# Patient Record
Sex: Female | Born: 2000 | Race: White | Hispanic: No | Marital: Single | Smoking: Current every day smoker
Health system: Southern US, Community
[De-identification: ages and names within clinical notes are randomized; demographics above are authoritative.]

## PROBLEM LIST (undated history)

## (undated) DIAGNOSIS — F419 Anxiety disorder, unspecified: Secondary | ICD-10-CM

## (undated) DIAGNOSIS — F32A Depression, unspecified: Secondary | ICD-10-CM

## (undated) DIAGNOSIS — E282 Polycystic ovarian syndrome: Secondary | ICD-10-CM

## (undated) DIAGNOSIS — F909 Attention-deficit hyperactivity disorder, unspecified type: Secondary | ICD-10-CM

## (undated) DIAGNOSIS — R569 Unspecified convulsions: Secondary | ICD-10-CM

## (undated) DIAGNOSIS — G40B09 Juvenile myoclonic epilepsy, not intractable, without status epilepticus: Secondary | ICD-10-CM

## (undated) HISTORY — DX: Polycystic ovarian syndrome: E28.2

## (undated) HISTORY — DX: Juvenile myoclonic epilepsy, not intractable, without status epilepticus: G40.B09

## (undated) HISTORY — DX: Depression, unspecified: F32.A

## (undated) HISTORY — DX: Anxiety disorder, unspecified: F41.9

## (undated) HISTORY — DX: Unspecified convulsions: R56.9

---
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
## 2019-02-14 ENCOUNTER — Other Ambulatory Visit: Payer: Self-pay

## 2019-02-14 ENCOUNTER — Telehealth: Payer: Self-pay

## 2019-02-14 DIAGNOSIS — Z79899 Other long term (current) drug therapy: Secondary | ICD-10-CM

## 2019-02-14 DIAGNOSIS — G40B09 Juvenile myoclonic epilepsy, not intractable, without status epilepticus: Secondary | ICD-10-CM

## 2019-02-14 NOTE — Telephone Encounter (Signed)
Pt mom called asking if it was time for pt to have her lamotrigine level checked?  Also asking for a letter on behalf of Lacey Bright because she did not do well in her freshman year of college she did not meet the 67% for her financial aide and they are doing an appeal for medical reasons because she had 4 seizures due to lack of sleep (nov, Dec, Feb and last April 12th) and also had increase in anxiety

## 2019-02-18 ENCOUNTER — Encounter: Payer: Self-pay | Admitting: Neurology

## 2019-02-23 ENCOUNTER — Telehealth: Payer: Self-pay

## 2019-02-23 ENCOUNTER — Encounter: Payer: Self-pay | Admitting: Neurology

## 2019-02-23 NOTE — Telephone Encounter (Signed)
Pt mother called informed that letter was at the front for her to be able to come by and pick up

## 2019-03-09 ENCOUNTER — Telehealth: Payer: Self-pay | Admitting: Neurology

## 2019-03-09 NOTE — Telephone Encounter (Signed)
Mom left vm about if we had results back from her levels (lab work) so she can figure out if she needs to change the meds. Please call her back. Thanks!

## 2019-03-10 NOTE — Telephone Encounter (Signed)
Left message for mom to return call.  Lamictal order was placed 02/14/19 but does not look like it was drawn. Called [facility] and the last order they have is from March. They will fax over the results. May need pt to come back and draw since June lab never performed.

## 2019-03-10 NOTE — Telephone Encounter (Signed)
Spoke with mom. Informed that June lab had not resulted. Mom will have pt come back here and have lab drawn. Order valid through 01/2020

## 2019-05-23 ENCOUNTER — Telehealth: Payer: Self-pay | Admitting: Neurology

## 2019-05-23 NOTE — Telephone Encounter (Signed)
Mother left VM about needing to speak with the nurse. Thanks!

## 2019-05-24 NOTE — Telephone Encounter (Signed)
Spoke with mother, she had a seizure over the weekend. She was drinking heavily, did not get good sleep, had a seizure with her roommates the next morning Saturday. Roommate said she turned blue for a short period of time, then she was fine. Called EMS. Currently she has been living in [place] in a townhouse taking online classes through [employer]. Mother also reports that she has not had her Lamictal level checked the last time.

## 2019-05-25 ENCOUNTER — Telehealth (INDEPENDENT_AMBULATORY_CARE_PROVIDER_SITE_OTHER): Payer: Self-pay | Admitting: Neurology

## 2019-05-25 ENCOUNTER — Other Ambulatory Visit: Payer: Self-pay

## 2019-05-25 ENCOUNTER — Encounter: Payer: Self-pay | Admitting: Neurology

## 2019-05-25 VITALS — Ht 64.5 in | Wt 164.0 lb

## 2019-05-25 DIAGNOSIS — G40B09 Juvenile myoclonic epilepsy, not intractable, without status epilepticus: Secondary | ICD-10-CM

## 2019-05-25 MED ORDER — LAMOTRIGINE ER 300 MG PO TB24: ORAL_TABLET | ORAL | 3 refills | Status: DC
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
## 2019-07-08 ENCOUNTER — Other Ambulatory Visit: Payer: Self-pay

## 2019-07-08 DIAGNOSIS — Z20822 Contact with and (suspected) exposure to covid-19: Secondary | ICD-10-CM

## 2019-07-09 LAB — NOVEL CORONAVIRUS, NAA: SARS-CoV-2, NAA: DETECTED — AB

## 2019-11-28 ENCOUNTER — Ambulatory Visit: Payer: Self-pay

## 2019-12-09 ENCOUNTER — Other Ambulatory Visit: Payer: Self-pay | Admitting: Neurology

## 2019-12-13 ENCOUNTER — Ambulatory Visit: Payer: Self-pay | Admitting: Neurology

## 2020-01-05 ENCOUNTER — Other Ambulatory Visit: Payer: Self-pay

## 2020-01-05 MED ORDER — LAMOTRIGINE ER 300 MG PO TB24: ORAL_TABLET | ORAL | 0 refills | Status: DC

## 2020-01-16 ENCOUNTER — Other Ambulatory Visit: Payer: Self-pay

## 2020-01-16 ENCOUNTER — Ambulatory Visit (HOSPITAL_COMMUNITY)
Admission: EM | Admit: 2020-01-16 | Discharge: 2020-01-16 | Disposition: A | Discharge status: Home / Self Care | Payer: BC Managed Care – PPO | Attending: Family Medicine | Admitting: Family Medicine

## 2020-01-16 ENCOUNTER — Encounter (HOSPITAL_COMMUNITY): Payer: Self-pay

## 2020-01-16 ENCOUNTER — Ambulatory Visit (INDEPENDENT_AMBULATORY_CARE_PROVIDER_SITE_OTHER): Payer: BC Managed Care – PPO

## 2020-01-16 DIAGNOSIS — J039 Acute tonsillitis, unspecified: Secondary | ICD-10-CM | POA: Diagnosis not present

## 2020-01-16 DIAGNOSIS — Z8669 Personal history of other diseases of the nervous system and sense organs: Secondary | ICD-10-CM | POA: Insufficient documentation

## 2020-01-16 DIAGNOSIS — Z20822 Contact with and (suspected) exposure to covid-19: Secondary | ICD-10-CM | POA: Insufficient documentation

## 2020-01-16 DIAGNOSIS — F909 Attention-deficit hyperactivity disorder, unspecified type: Secondary | ICD-10-CM | POA: Diagnosis not present

## 2020-01-16 DIAGNOSIS — Z79899 Other long term (current) drug therapy: Secondary | ICD-10-CM | POA: Diagnosis not present

## 2020-01-16 DIAGNOSIS — Z8616 Personal history of COVID-19: Secondary | ICD-10-CM | POA: Insufficient documentation

## 2020-01-16 DIAGNOSIS — J069 Acute upper respiratory infection, unspecified: Secondary | ICD-10-CM

## 2020-01-16 LAB — POCT RAPID STREP A: Streptococcus, Group A Screen (Direct): NEGATIVE

## 2020-01-16 MED ORDER — AMOXICILLIN-POT CLAVULANATE 875-125 MG PO TABS
1.0000 | ORAL_TABLET | Freq: Two times a day (BID) | ORAL | 0 refills | Status: DC
Start: 2020-01-16 — End: 2020-12-11

## 2020-01-16 NOTE — ED Triage Notes (Signed)
Pt is here with cough, sore throat, chills, nasal congestion for 4 days now. Pt has taken Mucinex to relieve discomfort.

## 2020-01-16 NOTE — Discharge Instructions (Signed)
I am giving you antibiotics because of the tonsillitis and the close exposure to pneumonia Rest and push fluids Return if not improving in a  few days
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
## 2020-01-17 LAB — SARS CORONAVIRUS 2 (TAT 6-24 HRS): SARS Coronavirus 2: NEGATIVE

## 2020-01-18 LAB — CULTURE, GROUP A STREP (THRC)

## 2020-04-02 ENCOUNTER — Other Ambulatory Visit: Payer: Self-pay | Admitting: Neurology

## 2020-05-01 ENCOUNTER — Ambulatory Visit: Payer: Self-pay | Admitting: Neurology

## 2020-05-01 ENCOUNTER — Telehealth (INDEPENDENT_AMBULATORY_CARE_PROVIDER_SITE_OTHER): Payer: Self-pay | Admitting: Neurology

## 2020-05-01 ENCOUNTER — Encounter: Payer: Self-pay | Admitting: Neurology

## 2020-05-01 ENCOUNTER — Other Ambulatory Visit: Payer: Self-pay | Admitting: Neurology

## 2020-05-01 ENCOUNTER — Other Ambulatory Visit: Payer: Self-pay

## 2020-05-01 VITALS — Wt 157.0 lb

## 2020-05-01 DIAGNOSIS — G40B09 Juvenile myoclonic epilepsy, not intractable, without status epilepticus: Secondary | ICD-10-CM
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
## 2020-05-30 ENCOUNTER — Encounter: Payer: Self-pay | Admitting: Neurology

## 2020-12-11 ENCOUNTER — Telehealth (INDEPENDENT_AMBULATORY_CARE_PROVIDER_SITE_OTHER): Payer: BC Managed Care – PPO | Admitting: Neurology

## 2020-12-11 ENCOUNTER — Encounter: Payer: Self-pay | Admitting: Neurology

## 2020-12-11 ENCOUNTER — Other Ambulatory Visit: Payer: Self-pay

## 2020-12-11 DIAGNOSIS — G40B09 Juvenile myoclonic epilepsy, not intractable, without status epilepticus: Secondary | ICD-10-CM

## 2020-12-11 MED ORDER — LAMOTRIGINE ER 300 MG PO TB24: 1.0000 | ORAL_TABLET | Freq: Every day | ORAL | 3 refills | Status: DC
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
## 2020-12-24 ENCOUNTER — Ambulatory Visit: Payer: Self-pay | Admitting: Neurology

## 2021-01-12 ENCOUNTER — Telehealth (HOSPITAL_COMMUNITY): Payer: Self-pay

## 2021-01-12 ENCOUNTER — Ambulatory Visit (HOSPITAL_COMMUNITY)
Admission: EM | Admit: 2021-01-12 | Discharge: 2021-01-12 | Disposition: A | Discharge status: Home / Self Care | Payer: BC Managed Care – PPO | Attending: Medical Oncology | Admitting: Medical Oncology

## 2021-01-12 ENCOUNTER — Other Ambulatory Visit: Payer: Self-pay

## 2021-01-12 ENCOUNTER — Encounter (HOSPITAL_COMMUNITY): Payer: Self-pay | Admitting: *Deleted

## 2021-01-12 DIAGNOSIS — J069 Acute upper respiratory infection, unspecified: Secondary | ICD-10-CM | POA: Insufficient documentation

## 2021-01-12 DIAGNOSIS — Z20822 Contact with and (suspected) exposure to covid-19: Secondary | ICD-10-CM | POA: Diagnosis not present

## 2021-01-12 DIAGNOSIS — R0602 Shortness of breath: Secondary | ICD-10-CM | POA: Insufficient documentation

## 2021-01-12 MED ORDER — BENZONATATE 100 MG PO CAPS
100.0000 mg | ORAL_CAPSULE | Freq: Three times a day (TID) | ORAL | 0 refills | Status: DC
Start: 2021-01-12 — End: 2021-01-12

## 2021-01-12 MED ORDER — FLUTICASONE PROPIONATE 50 MCG/ACT NA SUSP
2.0000 | Freq: Every day | NASAL | 0 refills | Status: DC
Start: 2021-01-12 — End: 2021-01-12

## 2021-01-12 MED ORDER — ALBUTEROL SULFATE HFA 108 (90 BASE) MCG/ACT IN AERS: 1.0000 | INHALATION_SPRAY | Freq: Four times a day (QID) | RESPIRATORY_TRACT | 0 refills | Status: DC | PRN

## 2021-01-12 MED ORDER — BENZONATATE 100 MG PO CAPS: 100.0000 mg | ORAL_CAPSULE | Freq: Three times a day (TID) | ORAL | 0 refills | Status: DC

## 2021-01-12 MED ORDER — FLUTICASONE PROPIONATE 50 MCG/ACT NA SUSP: 2.0000 | Freq: Every day | NASAL | 0 refills | Status: DC

## 2021-01-12 NOTE — ED Provider Notes (Signed)
[facility]    CSN: [id number] Arrival date & time: 01/12/21  1636      History   Chief Complaint Chief Complaint  Patient presents with  . Shortness of Breath  . Headache  . Fatigue    HPI Lacey Bright is a 20 y.o. female.   HPI   Cold Symptoms: Patient states that she has had fatigue, headaches and nasal congestion which are causing her some shortness of breath that she is not able to breathe out of her nose well for the past 2 days. 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 Pt at work is also sick with unknown illness. No chest pains, fevers, chest pain at rest.   Past Medical History:  Diagnosis Date  . ADHD (attention deficit hyperactivity disorder)   . Seizures (HCC)   . Seizures (HCC)    due to medicine 1x, then too much caffeine 2nd time    Patient Active Problem List   Diagnosis Date Noted  . 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 Single epileptic seizure (HCC) 09/20/2014    History reviewed. No pertinent surgical history.  OB History   No obstetric history on file.      Home Medications    Prior to Admission medications   Medication Sig Start Date End Date Taking? Authorizing Provider  desvenlafaxine (PRISTIQ) 50 MG 24 hr tablet Take 50 mg by mouth daily. Take 1 tablet daily for 1 week, then increase to 2 tablets    [provider]  LamoTRIgine 300 MG TB24 24 hour tablet Take 1 tablet (300 mg total) by mouth daily. 12/11/20   Van Clines, MD  levonorgestrel (MIRENA) 20 MCG/24HR IUD 1 each by Intrauterine route once.    [provider]  lisdexamfetamine (VYVANSE) 60 MG capsule Take 60 mg by mouth every morning.    [provider]  methylphenidate (RITALIN) 20 MG tablet Take 20 mg by mouth 2 (two) times daily. Takes 1 tablet at 6pm  01/16/20  [provider]    Family  History Family History  Problem Relation Age of Onset  . Seizures Other   . Breast cancer Mother   . Healthy Father   . Other Maternal Grandmother        Died at 47 due to car wreck   . Esophageal cancer Paternal Grandmother        Died at 55  . Bladder Cancer Paternal Grandfather        Died at 65    Social History Social History   Tobacco Use  . Smoking status: Never Smoker  . Smokeless tobacco: Never Used  Vaping Use  . Vaping Use: Some days  Substance Use Topics  . Alcohol use: Yes  . Drug use: Yes    Types: Marijuana     Allergies   Wellbutrin [bupropion] and Wellbutrin [bupropion]   Review of Systems Review of Systems  As stated above in HPI Physical Exam Triage Vital Signs ED Triage Vitals  Enc Vitals Group     BP 01/12/21 1656 124/83     Pulse Rate 01/12/21 1656 97     Resp 01/12/21 1656 18     Temp 01/12/21 1656 99 F (37.2 C)     Temp src --      SpO2 01/12/21 1656 97 %     Weight --      Height --  Head Circumference --      Peak Flow --      Pain Score 01/12/21 1653 8     Pain Loc --      Pain Edu? --      Excl. in GC? --    No data found.  Updated Vital Signs BP 124/83   Pulse 97   Temp 99 F (37.2 C)   Resp 18   LMP 01/05/2021   SpO2 97%   Physical Exam Vitals and nursing note reviewed.  Constitutional:      General: She is not in acute distress.    Appearance: She is well-developed. She is not ill-appearing, toxic-appearing or diaphoretic.     Comments: Pt sounds congested   HENT:     Head: Normocephalic and atraumatic.     Mouth/Throat:     Mouth: Mucous membranes are moist.     Pharynx: Oropharynx is clear.  Eyes:     Extraocular Movements: Extraocular movements intact.     Pupils: Pupils are equal, round, and reactive to light.  Cardiovascular:     Rate and Rhythm: Normal rate and regular rhythm.  Pulmonary:     Effort: Pulmonary effort is normal.     Breath sounds: Examination of the right-lower field reveals  wheezing. Examination of the left-lower field reveals wheezing. Wheezing present. No decreased breath sounds, rhonchi or rales.  Lymphadenopathy:     Cervical: No cervical adenopathy.  Neurological:     Mental Status: She is alert and oriented to person, place, and time.     Motor: No weakness.      UC Treatments / Results  Labs (all labs ordered are listed, but only abnormal results are displayed) Labs Reviewed - No data to display  EKG   Radiology No results found.  Procedures Procedures (including critical care time)  Medications Ordered in UC Medications - No data to display  Initial Impression / Assessment and Plan / UC Course  I have reviewed the triage vital signs and the nursing notes.  Pertinent labs & imaging results that were available during my care of the patient were reviewed by me and considered in my medical decision making (see chart for details).     New.  At this time we have elected to screen for COVID-19 and treat this as a viral illness with Flonase, Tessalon and supportive measures such as rest, hydration with fluids and we did discuss albuterol given her mild wheezing that she is having.  Encouraged her to stop vaping.  We also discussed red flag signs and symptoms.  Work note given. Final Clinical Impressions(s) / UC Diagnoses   Final diagnoses:  None   Discharge Instructions   None    ED Prescriptions    None     PDMP not reviewed this encounter.   Rushie Chestnut, [place] 01/12/21 1729

## 2021-01-12 NOTE — ED Triage Notes (Signed)
Pt reports fatigue,HA and nasal congestion.

## 2021-01-13 LAB — SARS CORONAVIRUS 2 (TAT 6-24 HRS): SARS Coronavirus 2: NEGATIVE

## 2021-01-18 ENCOUNTER — Ambulatory Visit (HOSPITAL_COMMUNITY)
Admission: EM | Admit: 2021-01-18 | Discharge: 2021-01-18 | Disposition: A | Discharge status: Home / Self Care | Payer: BC Managed Care – PPO | Attending: Medical Oncology | Admitting: Medical Oncology

## 2021-01-18 ENCOUNTER — Ambulatory Visit (INDEPENDENT_AMBULATORY_CARE_PROVIDER_SITE_OTHER): Payer: BC Managed Care – PPO

## 2021-01-18 ENCOUNTER — Encounter (HOSPITAL_COMMUNITY): Payer: Self-pay | Admitting: *Deleted

## 2021-01-18 ENCOUNTER — Other Ambulatory Visit: Payer: Self-pay

## 2021-01-18 DIAGNOSIS — M79671 Pain in right foot: Secondary | ICD-10-CM

## 2021-01-18 DIAGNOSIS — M79674 Pain in right toe(s): Secondary | ICD-10-CM

## 2021-01-18 DIAGNOSIS — W228XXA Striking against or struck by other objects, initial encounter: Secondary | ICD-10-CM

## 2021-01-18 MED ORDER — IBUPROFEN 800 MG PO TABS
800.0000 mg | ORAL_TABLET | Freq: Three times a day (TID) | ORAL | 0 refills | Status: DC
Start: 2021-01-18 — End: 2022-03-06

## 2021-01-18 NOTE — ED Triage Notes (Signed)
Pt injured Rt great toe .

## 2021-01-18 NOTE — ED Provider Notes (Signed)
[facility]    CSN: [id number] Arrival date & time: 01/18/21  1648      History   Chief Complaint Chief Complaint  Patient presents with  . Toe Injury    HPI Lacey Bright is a 20 y.o. female.   HPI   Toe Injury: Pt reports right great toe pain following an injury. She reports that she stubbed her right great toe hard on an [employer] at her house 2 days ago. Painful and bruising but pain improved some. Today she stubbed her toe when getting in her car today. Pain is also severe and she is concerned that she may have a fracture. She has not taken anything for pain. No numbness or tingling.   Past Medical History:  Diagnosis Date  . ADHD (attention deficit hyperactivity disorder)   . Seizures (HCC)   . Seizures (HCC)    due to medicine 1x, then too much caffeine 2nd time    Patient Active Problem List   Diagnosis Date Noted  . 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 Single epileptic seizure (HCC) 09/20/2014    History reviewed. No pertinent surgical history.  OB History   No obstetric history on file.      Home Medications    Prior to Admission medications   Medication Sig Start Date End Date Taking? Authorizing Provider  ibuprofen (ADVIL) 800 MG tablet Take 1 tablet (800 mg total) by mouth 3 (three) times daily. 01/18/21  Yes Carisa Backhaus M, PA-C  albuterol (VENTOLIN HFA) 108 (90 Base) MCG/ACT inhaler Inhale 1-2 puffs into the lungs every 6 (six) hours as needed for wheezing or shortness of breath. 01/12/21   Rushie Chestnut, PA-C  benzonatate (TESSALON) 100 MG capsule Take 1 capsule (100 mg total) by mouth every 8 (eight) hours. 01/12/21   Rushie Chestnut, PA-C  desvenlafaxine (PRISTIQ) 50 MG 24 hr tablet Take 50 mg by mouth daily. Take 1 tablet daily for 1 week, then increase to 2 tablets    [provider]   fluticasone (FLONASE) 50 MCG/ACT nasal spray Place 2 sprays into both nostrils daily. 01/12/21   Rushie Chestnut, PA-C  LamoTRIgine 300 MG TB24 24 hour tablet Take 1 tablet (300 mg total) by mouth daily. 12/11/20   Van Clines, MD  levonorgestrel (MIRENA) 20 MCG/24HR IUD 1 each by Intrauterine route once.    [provider]  lisdexamfetamine (VYVANSE) 60 MG capsule Take 60 mg by mouth every morning.    [provider]  methylphenidate (RITALIN) 20 MG tablet Take 20 mg by mouth 2 (two) times daily. Takes 1 tablet at 6pm  01/16/20  [provider]    Family History Family History  Problem Relation Age of Onset  . Seizures Other   . Breast cancer Mother   . Healthy Father   . Other Maternal Grandmother        Died at 52 due to car wreck   . Esophageal cancer Paternal Grandmother        Died at 54  . Bladder Cancer Paternal Grandfather        Died at 42    Social History Social History   Tobacco Use  . Smoking status: Never Smoker  . Smokeless tobacco: Never Used  Vaping Use  . Vaping Use: Some days  Substance Use Topics  . Alcohol use: Yes  . Drug use: Yes  Types: Marijuana     Allergies   Wellbutrin [bupropion] and Wellbutrin [bupropion]   Review of Systems Review of Systems  As stated above in HPI Physical Exam Triage Vital Signs ED Triage Vitals  Enc Vitals Group     BP 01/18/21 1723 131/87     Pulse Rate 01/18/21 1723 96     Resp 01/18/21 1723 18     Temp 01/18/21 1723 98.5 F (36.9 C)     Temp src --      SpO2 01/18/21 1723 99 %     Weight --      Height --      Head Circumference --      Peak Flow --      Pain Score 01/18/21 1720 6     Pain Loc --      Pain Edu? --      Excl. in GC? --    No data found.  Updated Vital Signs BP 131/87   Pulse 96   Temp 98.5 F (36.9 C)   Resp 18   LMP 01/05/2021   SpO2 99%   Physical Exam Vitals and nursing note reviewed.  Cardiovascular:     Pulses: Normal pulses.   Musculoskeletal:        General: Swelling and tenderness (entire right great toe) present.     Comments: ROM of right toe limited   Skin:    Capillary Refill: Capillary refill takes less than 2 seconds.     Findings: Bruising present.     Comments: No skin breakdown  Neurological:     Mental Status: She is oriented to person, place, and time.      UC Treatments / Results  Labs (all labs ordered are listed, but only abnormal results are displayed) Labs Reviewed - No data to display  EKG   Radiology DG Toe Great Right  Result Date: 01/18/2021 CLINICAL DATA:  Blunt trauma to the first toe with pain, initial encounter EXAM: RIGHT GREAT TOE COMPARISON:  None. FINDINGS: No acute fracture or dislocation is noted. A small defect is noted in the distal aspect of the first proximal phalanx dorsally. This may be related to prior trauma. No definitive fracture is seen. IMPRESSION: No acute abnormality noted. Electronically Signed   By: Alcide Clever M.D.   On: 01/18/2021 18:21    Procedures Procedures (including critical care time)  Medications Ordered in UC Medications - No data to display  Initial Impression / Assessment and Plan / UC Course  I have reviewed the triage vital signs and the nursing notes.  Pertinent labs & imaging results that were available during my care of the patient were reviewed by me and considered in my medical decision making (see chart for details).     New. X ray pending.   UPDATE: X ray does not show evidence of fracture. Supportive walking shoes and buddy taping will likely be beneficial along with RICE and PRN NSAIDs.  Final Clinical Impressions(s) / UC Diagnoses   Final diagnoses:  Great toe pain, right   Discharge Instructions   None    ED Prescriptions    Medication Sig Dispense Auth. Provider   ibuprofen (ADVIL) 800 MG tablet Take 1 tablet (800 mg total) by mouth 3 (three) times daily. 21 tablet Rushie Chestnut, [place]     PDMP not  reviewed this encounter.   Rushie Chestnut, [place] 01/18/21 1827

## 2021-03-17 ENCOUNTER — Other Ambulatory Visit: Payer: Self-pay

## 2021-03-17 ENCOUNTER — Encounter (HOSPITAL_COMMUNITY): Payer: Self-pay | Admitting: Emergency Medicine

## 2021-03-17 ENCOUNTER — Ambulatory Visit (INDEPENDENT_AMBULATORY_CARE_PROVIDER_SITE_OTHER): Payer: BC Managed Care – PPO

## 2021-03-17 ENCOUNTER — Ambulatory Visit (HOSPITAL_COMMUNITY)
Admission: EM | Admit: 2021-03-17 | Discharge: 2021-03-17 | Disposition: A | Discharge status: Home / Self Care | Payer: BC Managed Care – PPO | Attending: Internal Medicine | Admitting: Internal Medicine

## 2021-03-17 DIAGNOSIS — M546 Pain in thoracic spine: Secondary | ICD-10-CM | POA: Diagnosis not present

## 2021-03-17 DIAGNOSIS — S20229A Contusion of unspecified back wall of thorax, initial encounter: Secondary | ICD-10-CM | POA: Diagnosis not present

## 2021-03-17 DIAGNOSIS — W19XXXA Unspecified fall, initial encounter: Secondary | ICD-10-CM

## 2021-03-17 NOTE — Discharge Instructions (Signed)
You spine x-ray was negative for any fracture. Your pain is likely related to acute bruising of back. Please apply ice to affected area for 15 minutes 2-3 times daily. May take ibuprofen or tylenol as needed for pain. Please follow up if pain does not improve.

## 2021-03-17 NOTE — ED Provider Notes (Signed)
[facility]    CSN: [id number] Arrival date & time: 03/17/21  1214      History   Chief Complaint Chief Complaint  Patient presents with   Back Pain    HPI Lacey Bright is a 20 y.o. female.   Patient presents to the urgent care for further evaluation from a fall that occurred yesterday at work. 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 She landed on her butt in a "seated position". Denies hitting her head or losing consciousness. Having mild pain in mid thoracic region of back. Denies any numbness or tingling. Denies any pain that radiates down her legs. Denies any pain in any other part of her body. Has taken ibuprofen with some relief of pain. Patient felt as though she could manage pain at home, but she missed work today and employer is requiring evaluation by healthcare provider.    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 No pertinent surgical history.  OB History   No obstetric history on file.      Home Medications    Prior to Admission medications   Medication Sig Start Date End Date Taking? Authorizing Provider  albuterol (VENTOLIN HFA) 108 (90 Base) MCG/ACT inhaler Inhale 1-2 puffs into the lungs every 6 (six) hours as needed for wheezing or shortness of breath. 01/12/21  Yes Clent Jacks M, PA-C  desvenlafaxine (PRISTIQ) 50 MG 24 hr tablet Take 50 mg by mouth daily. Take 1 tablet daily for 1 week, then increase to 2 tablets   Yes [provider]  LamoTRIgine 300 MG TB24 24 hour tablet Take 1 tablet (300 mg total) by mouth daily. 12/11/20  Yes Van Clines, MD  levonorgestrel (MIRENA) 20 MCG/24HR IUD 1 each by Intrauterine route once.   Yes [provider]  lisdexamfetamine (VYVANSE) 60 MG capsule Take 60 mg by mouth every morning.   Yes [provider]  benzonatate (TESSALON) 100 MG capsule Take 1 capsule (100 mg total) by mouth every 8 (eight) hours. Patient not taking: Reported on 03/17/2021 01/12/21   Rushie Chestnut, PA-C  fluticasone [facility]) 50 MCG/ACT nasal spray Place 2 sprays into both nostrils daily. Patient not taking: Reported on 03/17/2021 01/12/21   Rushie Chestnut, PA-C  ibuprofen (ADVIL) 800 MG tablet Take 1 tablet (800 mg total) by mouth 3 (three) times daily. Patient not taking: Reported on 03/17/2021 01/18/21   Rushie Chestnut, PA-C  methylphenidate (RITALIN) 20 MG tablet Take 20 mg by mouth 2 (two) times daily. Takes 1 tablet at 6pm  01/16/20  [provider]    Family History Family History  Problem Relation Age of Onset   Seizures Other    Breast cancer Mother    Healthy Father    Other Maternal Grandmother        Died at 26 due to car wreck    Esophageal cancer Paternal Grandmother        Died at 46  Bladder Cancer Paternal Grandfather        Died at 65    Social History Social History   Tobacco Use   Smoking status: Never   Smokeless tobacco: Never  Vaping Use   Vaping Use: Some days  Substance Use Topics   Alcohol use: Yes   Drug use: Yes    Types: Marijuana     Allergies   Wellbutrin [bupropion] and Wellbutrin [bupropion]   Review of Systems Review of Systems Per HPI  Physical Exam Triage Vital Signs ED Triage Vitals  Enc Vitals Group     BP 03/17/21 1315 113/73     Pulse Rate 03/17/21 1315 84     Resp 03/17/21 1315 18     Temp 03/17/21 1315 98.5 F (36.9 C)     Temp Source 03/17/21 1315 Oral     SpO2 03/17/21 1315 97 %     Weight --       Height --      Head Circumference --      Peak Flow --      Pain Score 03/17/21 1311 3     Pain Loc --      Pain Edu? --      Excl. in GC? --    No data found.  Updated Vital Signs BP 113/73 (BP Location: Left Arm)   Pulse 84   Temp 98.5 F (36.9 C) (Oral)   Resp 18   SpO2 97%   Visual Acuity Right Eye Distance:   Left Eye Distance:   Bilateral Distance:    Right Eye Near:   Left Eye Near:    Bilateral Near:     Physical Exam Constitutional:      Appearance: Normal appearance.  HENT:     Head: Normocephalic and atraumatic.  Eyes:     Extraocular Movements: Extraocular movements intact.     Conjunctiva/sclera: Conjunctivae normal.  Pulmonary:     Effort: Pulmonary effort is normal.  Musculoskeletal:     Thoracic back: Tenderness present. No swelling or edema.     Comments: Tenderness to palpation to area directly over thoracic spine at approximately T9-T11.   Neurological:     General: No focal deficit present.     Mental Status: She is alert and oriented to person, place, and time. Mental status is at baseline.     Deep Tendon Reflexes: Reflexes are normal and symmetric.  Psychiatric:        Mood and Affect: Mood normal.        Behavior: Behavior normal.        Thought Content: Thought content normal.        Judgment: Judgment normal.     UC Treatments / Results  Labs (all labs ordered are listed, but only abnormal results are displayed) Labs Reviewed - No data to display  EKG   Radiology DG Thoracic Spine 2 View  Result Date: 03/17/2021 CLINICAL DATA:  Acute back pain following fall yesterday. Initial encounter. EXAM: THORACIC SPINE 2 VIEWS COMPARISON:  None. FINDINGS: There is no evidence of thoracic spine fracture. Alignment is normal. No other significant bone abnormalities are identified. IMPRESSION: Negative. Electronically Signed   By: Harmon Pier M.D.   On: 03/17/2021 13:43    Procedures Procedures (including critical care  time)  Medications Ordered in UC Medications - No data to display  Initial Impression / Assessment and Plan / UC Course  I have reviewed the triage vital signs and the nursing notes.  Pertinent labs & imaging results that were available during my care of the patient were reviewed by me and considered in my medical decision making (see chart for details).     Thoracic spine x-ray was negative for any fracture or any acute bony abnormality. Suspect contusion to back from impact of hostess stand to area of pain with fall. Patient to apply ice to affected area and take tylenol or ibuprofen as needed for pain. Patient was advised to follow up if pain does not improve.  Discussed strict return precautions. Patient verbalized understanding and is agreeable with plan.  Final Clinical Impressions(s) / UC Diagnoses   Final diagnoses:  Contusion of back, unspecified laterality, initial encounter  Acute midline thoracic back pain  Fall, initial encounter     Discharge Instructions      You spine x-ray was negative for any fracture. Your pain is likely related to acute bruising of back. Please apply ice to affected area for 15 minutes 2-3 times daily. May take ibuprofen or tylenol as needed for pain. Please follow up if pain does not improve.      ED Prescriptions   None    PDMP not reviewed this encounter.   Lance Muss, FNP 03/17/21 1401

## 2021-03-17 NOTE — ED Triage Notes (Signed)
Fell backwards, striking mid back on the edge of a stand.  Pack is sore, no pain in legs.  Patient did not feel she could work today.  No marking on back, but has pain

## 2021-05-30 ENCOUNTER — Other Ambulatory Visit: Payer: Self-pay

## 2021-05-30 MED ORDER — LAMOTRIGINE ER 300 MG PO TB24: 1.0000 | ORAL_TABLET | Freq: Every day | ORAL | 0 refills | Status: DC

## 2021-08-18 ENCOUNTER — Other Ambulatory Visit: Payer: Self-pay | Admitting: Neurology

## 2021-08-20 ENCOUNTER — Other Ambulatory Visit: Payer: Self-pay

## 2021-08-20 ENCOUNTER — Encounter: Payer: Self-pay | Admitting: Neurology

## 2021-08-20 ENCOUNTER — Telehealth: Payer: Self-pay | Admitting: Neurology

## 2021-08-20 ENCOUNTER — Other Ambulatory Visit (INDEPENDENT_AMBULATORY_CARE_PROVIDER_SITE_OTHER): Payer: BC Managed Care – PPO

## 2021-08-20 ENCOUNTER — Ambulatory Visit (INDEPENDENT_AMBULATORY_CARE_PROVIDER_SITE_OTHER): Payer: BC Managed Care – PPO | Admitting: Neurology

## 2021-08-20 VITALS — BP 131/83 | HR 108 | Resp 18 | Ht 65.0 in | Wt 192.0 lb

## 2021-08-20 DIAGNOSIS — G40B09 Juvenile myoclonic epilepsy, not intractable, without status epilepticus: Secondary | ICD-10-CM | POA: Diagnosis not present

## 2021-08-20 MED ORDER — CLONAZEPAM 0.5 MG PO TABS: ORAL_TABLET | ORAL | 5 refills | Status: DC

## 2021-08-20 MED ORDER — LAMOTRIGINE ER 300 MG PO TB24: 1.0000 | ORAL_TABLET | Freq: Every day | ORAL | 3 refills | Status: DC

## 2021-08-20 NOTE — Patient Instructions (Addendum)
Always good to see you!  Check Lamictal level  2. Continue Lamotrigine ER 300mg : take 1 tablet daily  3. If tics/jerks last longer than 10 minutes, you can take the as needed clonazepam 0.5mg  tablet. Do not take more than 2 a week  3. Continue with goal of alcohol reduction   5. Follow-up in 6-8 months, call for any changes. Good luck!!   Seizure Precautions: 1. If medication has been prescribed for you to prevent seizures, take it exactly as directed.  Do not stop taking the medicine without talking to your doctor first, even if you have not had a seizure in a long time.   2. Avoid activities in which a seizure would cause danger to yourself or to others.  Don't operate dangerous machinery, swim alone, or climb in high or dangerous places, such as on ladders, roofs, or girders.  Do not drive unless your doctor says you may.  3. If you have any warning that you may have a seizure, lay down in a safe place where you can't hurt yourself.    4.  No driving for 6 months from last seizure, as per [facility].   Please refer to the following link on the [employer]'s website for more information: http://www.epilepsyfoundation.org/answerplace/Social/driving/drivingu.cfm   5.  Maintain good sleep hygiene. Avoid alcohol.  6.  Notify your neurology if you are planning pregnancy or if you become pregnant.  7.  Contact your doctor if you have any problems that may be related to the medicine you are taking.  8.  Call 911 and bring the patient back to the ED if:        A.  The seizure lasts longer than 5 minutes.       B.  The patient doesn't awaken shortly after the seizure  C.  The patient has new problems such as difficulty seeing, speaking or moving  D.  The patient was injured during the seizure  E.  The patient has a temperature over 102 F (39C)  F.  The patient vomited and now is having trouble breathing       Your provider has requested that you have  labwork completed today. Please go to [facility] ([id number]) on the second floor of this building before leaving the office today. You do not need to check in. If you are not called within 15 minutes please check with the front desk.
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
## 2021-08-20 NOTE — Telephone Encounter (Signed)
Patient is going to 211 for labs, called patient back.

## 2021-08-21 LAB — LAMOTRIGINE LEVEL: Lamotrigine Lvl: 3.9 ug/mL (ref 2.0–20.0)

## 2021-12-17 ENCOUNTER — Telehealth: Payer: Self-pay | Admitting: Neurology

## 2021-12-17 NOTE — Telephone Encounter (Signed)
Patient was given rx Clonazepam 08/20/21 with 5 refills. She should still have at least 2 refills on file. Instructed not to exceed 2 in a week.  ?

## 2021-12-17 NOTE — Telephone Encounter (Signed)
The following message was left with [facility] on 12/16/21 at 7:50 AM. ? ?Caller states her daughter needs a refill on Clonazepam. Reports her daughter has been shaky today/hands are trembling . Had a significant amount of alcohol yesterday. ? ?Patient advised by triage nurse to proceed to ED and caller expressed understanding. ? ?See detailed report in doctor's inbox. ?

## 2021-12-17 NOTE — Telephone Encounter (Signed)
Called patients mother and she stated she had called thinking we could transfer the clonazepam  rx to a 24 hour pharmacy. She stated that she will just keep the rx at the pharmacy it is at and has requested the refills today. Patients mother stated that patient is not in need of anything from [place] at this time. ?

## 2022-03-06 ENCOUNTER — Encounter: Payer: Self-pay | Admitting: Neurology

## 2022-03-06 ENCOUNTER — Ambulatory Visit: Payer: BC Managed Care – PPO | Admitting: Neurology

## 2022-03-06 VITALS — BP 124/91 | HR 104 | Ht 64.5 in | Wt 201.0 lb

## 2022-03-06 DIAGNOSIS — G40B09 Juvenile myoclonic epilepsy, not intractable, without status epilepticus: Secondary | ICD-10-CM

## 2022-03-06 MED ORDER — LAMOTRIGINE ER 200 MG PO TB24: ORAL_TABLET | ORAL | 11 refills | Status: DC
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
## 2022-03-06 NOTE — Patient Instructions (Signed)
Always good to see you.  Increase Lamotrigine ER 200mg : take 2 tablets daily  2. Have Lamictal level done in 2 weeks  3. Let me know if you need refills for the clonazepam  4. Continue working on cutting down and eventually stopping alcohol   5. Follow-up in 6 months, call for any changes   Seizure Precautions: 1. If medication has been prescribed for you to prevent seizures, take it exactly as directed.  Do not stop taking the medicine without talking to your doctor first, even if you have not had a seizure in a long time.   2. Avoid activities in which a seizure would cause danger to yourself or to others.  Don't operate dangerous machinery, swim alone, or climb in high or dangerous places, such as on ladders, roofs, or girders.  Do not drive unless your doctor says you may.  3. If you have any warning that you may have a seizure, lay down in a safe place where you can't hurt yourself.    4.  No driving for 6 months from last seizure, as per [facility]   Please refer to the following link on the Epilepsy Foundation of America's website for more information: http://www.epilepsyfoundation.org/answerplace/Social/driving/drivingu.cfm   5.  Maintain good sleep hygiene.   6.  Notify your neurology if you are planning pregnancy or if you become pregnant.  7.  Contact your doctor if you have any problems that may be related to the medicine you are taking.  8.  Call 911 and bring the patient back to the ED if:        A.  The seizure lasts longer than 5 minutes.       B.  The patient doesn't awaken shortly after the seizure  C.  The patient has new problems such as difficulty seeing, speaking or moving  D.  The patient was injured during the seizure  E.  The patient has a temperature over 102 F (39C)  F.  The patient vomited and now is having trouble breathing

## 2022-03-22 IMAGING — DX DG TOE GREAT 2+V*R*
3 series · 3 of 3 positions shown · non-contrast
Comparison: None.

CLINICAL DATA: Blunt trauma to the first toe with pain, initial
encounter

EXAM:
RIGHT GREAT TOE

[toe ap]
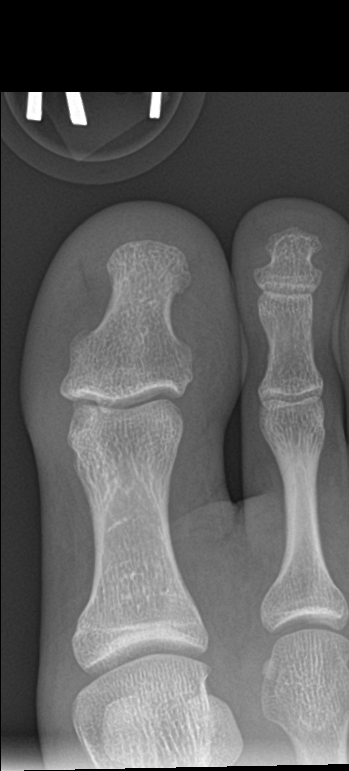

[toe obl]
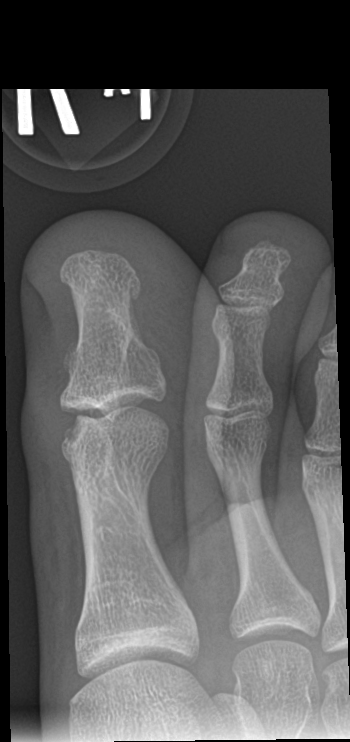

[toe lat]
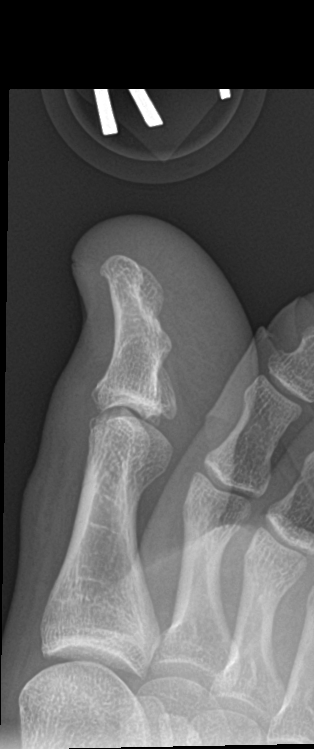

[3 of 3 positions shown; findings below may reference images not displayed]

FINDINGS: No acute fracture or dislocation is noted. A small defect is noted
in the distal aspect of the first proximal phalanx dorsally. This
may be related to prior trauma. No definitive fracture is seen.
IMPRESSION: No acute abnormality noted.

## 2022-05-16 ENCOUNTER — Other Ambulatory Visit: Payer: Self-pay | Admitting: Neurology

## 2022-05-16 NOTE — Telephone Encounter (Signed)
Pt's mother called in stating the pt needs her clonazepam refill ASAP.

## 2022-05-19 IMAGING — DX DG THORACIC SPINE 2V
2 series · 2 of 2 positions shown · non-contrast
Comparison: None.

CLINICAL DATA: Acute back pain following fall yesterday. Initial
encounter.

EXAM:
THORACIC SPINE 2 VIEWS

[t-spine ap]
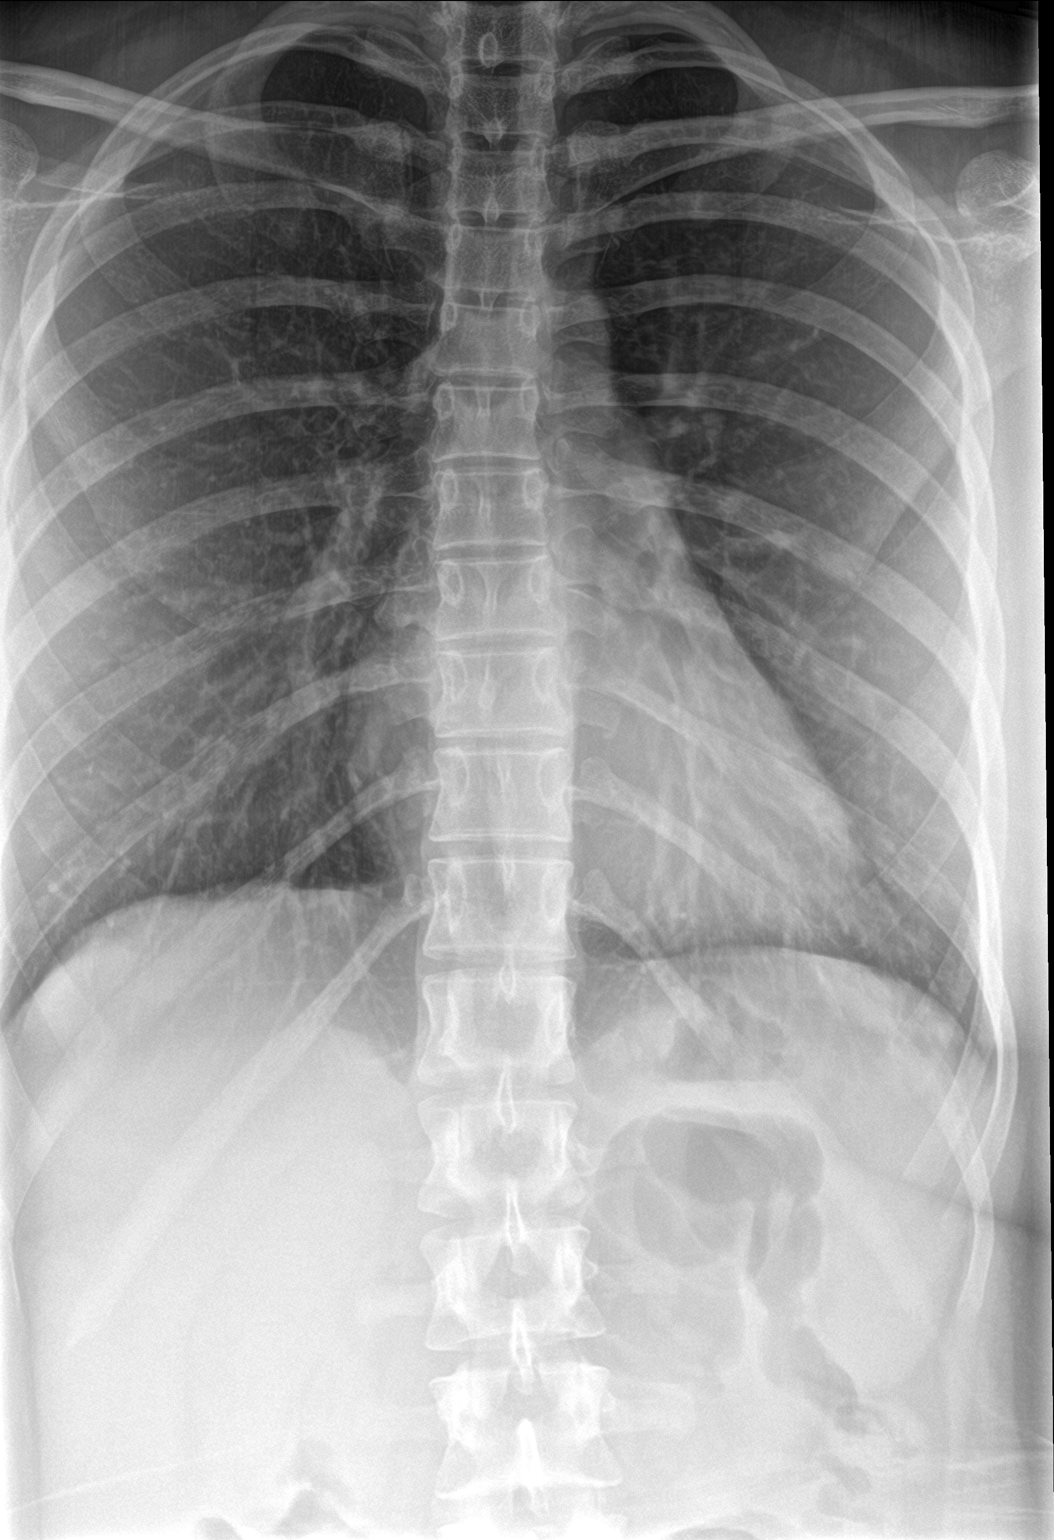

[t-spine lat]
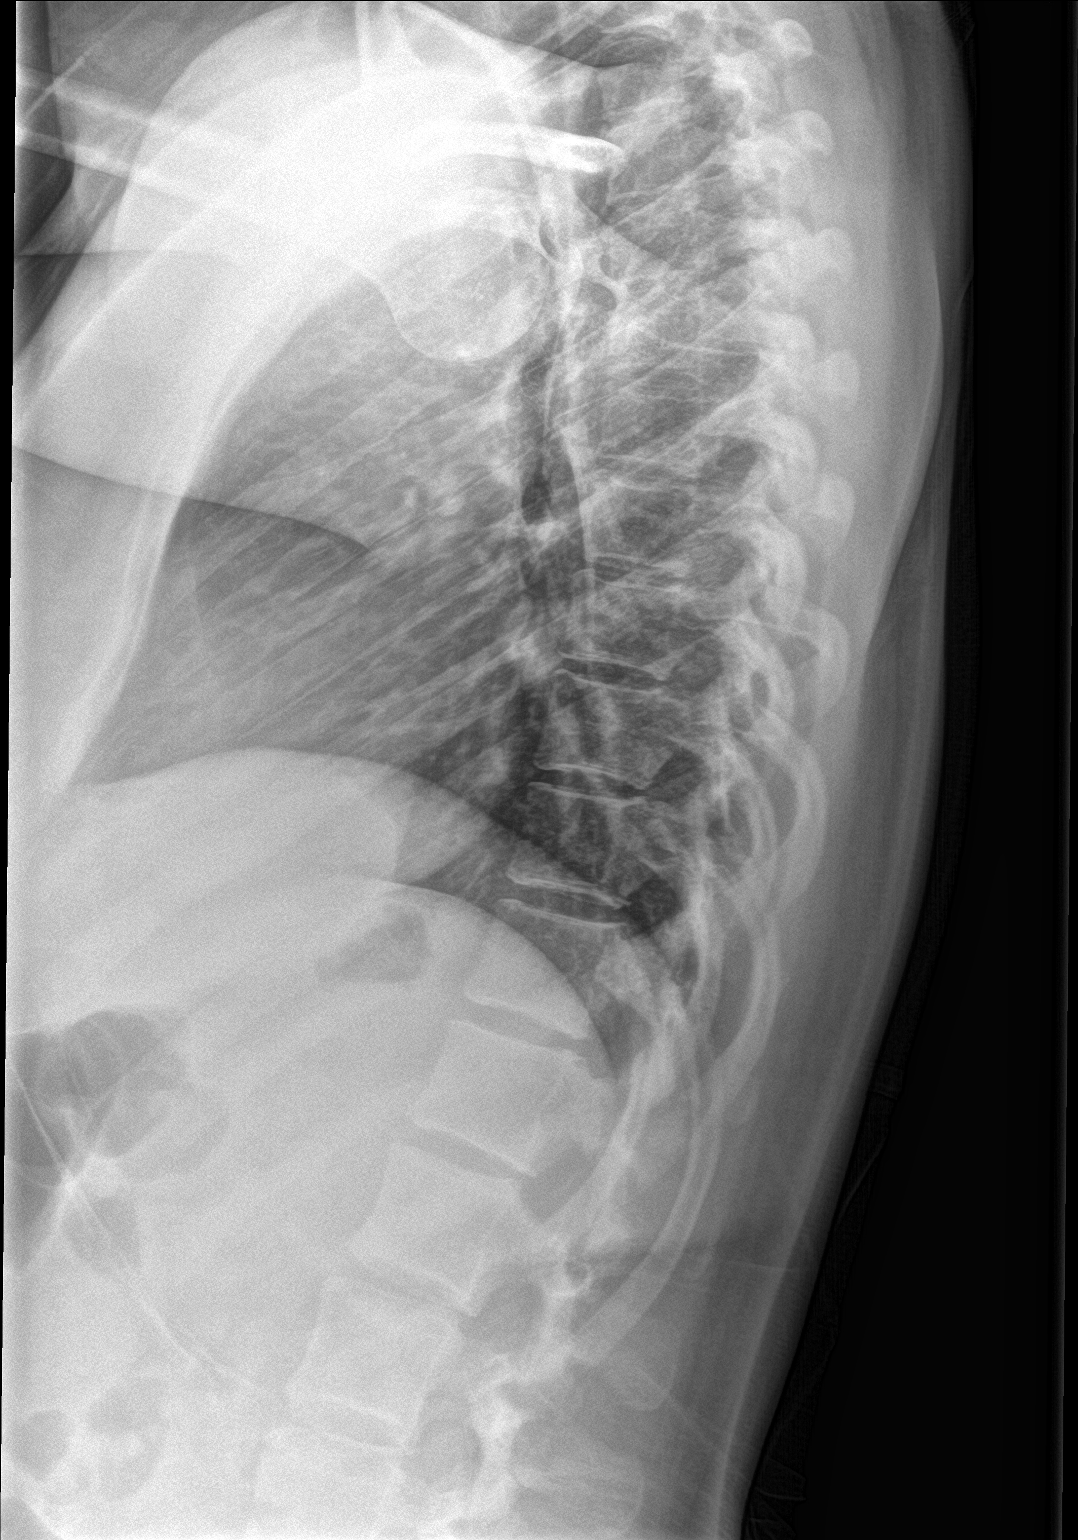

[2 of 2 positions shown; findings below may reference images not displayed]

FINDINGS: There is no evidence of thoracic spine fracture. Alignment is
normal. No other significant bone abnormalities are identified.
IMPRESSION: Negative.

## 2022-06-12 ENCOUNTER — Ambulatory Visit (INDEPENDENT_AMBULATORY_CARE_PROVIDER_SITE_OTHER): Payer: BC Managed Care – PPO

## 2022-06-12 ENCOUNTER — Encounter: Payer: Self-pay | Admitting: Podiatry

## 2022-06-12 ENCOUNTER — Ambulatory Visit: Payer: BC Managed Care – PPO | Admitting: Podiatry

## 2022-06-12 DIAGNOSIS — M778 Other enthesopathies, not elsewhere classified: Secondary | ICD-10-CM

## 2022-06-12 MED ORDER — DICLOFENAC SODIUM 75 MG PO TBEC: 75.0000 mg | DELAYED_RELEASE_TABLET | Freq: Two times a day (BID) | ORAL | 2 refills | Status: DC
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
## 2022-06-28 ENCOUNTER — Other Ambulatory Visit: Payer: Self-pay | Admitting: Neurology

## 2022-07-13 ENCOUNTER — Ambulatory Visit (HOSPITAL_COMMUNITY)
Admission: EM | Admit: 2022-07-13 | Discharge: 2022-07-13 | Disposition: A | Discharge status: Home / Self Care | Payer: BC Managed Care – PPO

## 2022-07-13 DIAGNOSIS — Z638 Other specified problems related to primary support group: Secondary | ICD-10-CM

## 2022-07-13 DIAGNOSIS — F1010 Alcohol abuse, uncomplicated: Secondary | ICD-10-CM | POA: Diagnosis not present

## 2022-07-13 DIAGNOSIS — F411 Generalized anxiety disorder: Secondary | ICD-10-CM | POA: Diagnosis not present
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
## 2022-07-13 NOTE — Discharge Instructions (Signed)
Take all medications as prescribed. Keep all follow-up appointments as scheduled.  Do not consume alcohol or use illegal drugs while on prescription medications. Report any adverse effects from your medications to your primary care provider promptly.  In the event of recurrent symptoms or worsening symptoms, call 911, a crisis hotline, or go to the nearest emergency department for evaluation.   

## 2022-08-06 ENCOUNTER — Encounter (INDEPENDENT_AMBULATORY_CARE_PROVIDER_SITE_OTHER): Payer: BC Managed Care – PPO | Admitting: Family Medicine

## 2022-09-03 ENCOUNTER — Encounter (INDEPENDENT_AMBULATORY_CARE_PROVIDER_SITE_OTHER): Payer: BC Managed Care – PPO | Admitting: Family Medicine

## 2022-09-05 ENCOUNTER — Other Ambulatory Visit (HOSPITAL_BASED_OUTPATIENT_CLINIC_OR_DEPARTMENT_OTHER): Payer: Self-pay

## 2022-09-05 MED ORDER — ZEPBOUND 2.5 MG/0.5ML ~~LOC~~ SOAJ
2.5000 mg | SUBCUTANEOUS | 0 refills | Status: DC
  Filled 2022-09-05 – 2022-09-19 (×2): qty 2, 28d supply, fill #0

## 2022-09-11 ENCOUNTER — Other Ambulatory Visit (HOSPITAL_BASED_OUTPATIENT_CLINIC_OR_DEPARTMENT_OTHER): Payer: Self-pay

## 2022-09-11 DIAGNOSIS — Z0289 Encounter for other administrative examinations: Secondary | ICD-10-CM

## 2022-09-17 ENCOUNTER — Encounter (INDEPENDENT_AMBULATORY_CARE_PROVIDER_SITE_OTHER): Payer: BC Managed Care – PPO | Admitting: Family Medicine

## 2022-09-18 ENCOUNTER — Other Ambulatory Visit (HOSPITAL_BASED_OUTPATIENT_CLINIC_OR_DEPARTMENT_OTHER): Payer: Self-pay

## 2022-09-19 ENCOUNTER — Other Ambulatory Visit (HOSPITAL_BASED_OUTPATIENT_CLINIC_OR_DEPARTMENT_OTHER): Payer: Self-pay

## 2022-09-25 ENCOUNTER — Other Ambulatory Visit (HOSPITAL_BASED_OUTPATIENT_CLINIC_OR_DEPARTMENT_OTHER): Payer: Self-pay

## 2022-10-01 ENCOUNTER — Encounter: Payer: Self-pay | Admitting: Neurology

## 2022-10-01 ENCOUNTER — Ambulatory Visit: Payer: BC Managed Care – PPO | Admitting: Neurology

## 2022-10-01 VITALS — BP 100/65 | HR 87 | Ht 64.5 in | Wt 181.8 lb

## 2022-10-01 DIAGNOSIS — G40B09 Juvenile myoclonic epilepsy, not intractable, without status epilepticus: Secondary | ICD-10-CM

## 2022-10-01 MED ORDER — CLONAZEPAM 0.5 MG PO TABS: ORAL_TABLET | ORAL | 5 refills | Status: DC

## 2022-10-01 MED ORDER — LAMOTRIGINE ER 200 MG PO TB24: ORAL_TABLET | ORAL | 11 refills | Status: DC
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
## 2022-10-07 ENCOUNTER — Encounter (INDEPENDENT_AMBULATORY_CARE_PROVIDER_SITE_OTHER): Payer: Self-pay | Admitting: Family Medicine

## 2022-10-07 ENCOUNTER — Ambulatory Visit (INDEPENDENT_AMBULATORY_CARE_PROVIDER_SITE_OTHER): Payer: BC Managed Care – PPO | Admitting: Family Medicine

## 2022-10-07 VITALS — BP 108/69 | HR 79 | Temp 98.7°F | Ht 64.0 in

## 2022-10-07 DIAGNOSIS — R5383 Other fatigue: Secondary | ICD-10-CM | POA: Diagnosis not present

## 2022-10-07 DIAGNOSIS — G40B09 Juvenile myoclonic epilepsy, not intractable, without status epilepticus: Secondary | ICD-10-CM

## 2022-10-07 DIAGNOSIS — F32A Depression, unspecified: Secondary | ICD-10-CM

## 2022-10-07 DIAGNOSIS — R0602 Shortness of breath: Secondary | ICD-10-CM

## 2022-10-07 DIAGNOSIS — E282 Polycystic ovarian syndrome: Secondary | ICD-10-CM | POA: Diagnosis not present

## 2022-10-07 DIAGNOSIS — F419 Anxiety disorder, unspecified: Secondary | ICD-10-CM

## 2022-10-07 DIAGNOSIS — E669 Obesity, unspecified: Secondary | ICD-10-CM | POA: Diagnosis not present

## 2022-10-07 DIAGNOSIS — Z6831 Body mass index (BMI) 31.0-31.9, adult: Secondary | ICD-10-CM | POA: Diagnosis not present

## 2022-10-09 LAB — CBC WITH DIFFERENTIAL/PLATELET
Basophils Absolute: 0 10*3/uL (ref 0.0–0.2)
Basos: 0 %
EOS (ABSOLUTE): 0.1 10*3/uL (ref 0.0–0.4)
Eos: 1 %
Hematocrit: 49.9 % — ABNORMAL HIGH (ref 34.0–46.6)
Hemoglobin: 16.9 g/dL — ABNORMAL HIGH (ref 11.1–15.9)
Immature Grans (Abs): 0 10*3/uL (ref 0.0–0.1)
Immature Granulocytes: 0 %
Lymphocytes Absolute: 2.2 10*3/uL (ref 0.7–3.1)
Lymphs: 28 %
MCH: 30.4 pg (ref 26.6–33.0)
MCHC: 33.9 g/dL (ref 31.5–35.7)
MCV: 90 fL (ref 79–97)
Monocytes Absolute: 0.4 10*3/uL (ref 0.1–0.9)
Monocytes: 5 %
Neutrophils Absolute: 5.3 10*3/uL (ref 1.4–7.0)
Neutrophils: 66 %
Platelets: 453 10*3/uL — ABNORMAL HIGH (ref 150–450)
RBC: 5.56 x10E6/uL — ABNORMAL HIGH (ref 3.77–5.28)
RDW: 13 % (ref 11.7–15.4)
WBC: 8.1 10*3/uL (ref 3.4–10.8)

## 2022-10-09 LAB — LIPID PANEL WITH LDL/HDL RATIO
Cholesterol, Total: 156 mg/dL (ref 100–199)
HDL: 61 mg/dL (ref >39)
LDL Chol Calc (NIH): 78 mg/dL (ref 0–99)
LDL/HDL Ratio: 1.3 ratio (ref 0.0–3.2)
Triglycerides: 90 mg/dL (ref 0–149)
VLDL Cholesterol Cal: 17 mg/dL (ref 5–40)

## 2022-10-09 LAB — HEMOGLOBIN A1C
Est. average glucose Bld gHb Est-mCnc: 94 mg/dL
Hgb A1c MFr Bld: 4.9 % (ref 4.8–5.6)

## 2022-10-09 LAB — VITAMIN B12: Vitamin B-12: 394 pg/mL (ref 232–1245)

## 2022-10-09 LAB — TSH: TSH: 1.6 u[IU]/mL (ref 0.450–4.500)

## 2022-10-09 LAB — T4, FREE: Free T4: 1.4 ng/dL (ref 0.82–1.77)

## 2022-10-09 LAB — INSULIN, RANDOM: INSULIN: 1.8 u[IU]/mL — ABNORMAL LOW (ref 2.6–24.9)

## 2022-10-09 LAB — T3: T3, Total: 126 ng/dL (ref 71–180)

## 2022-10-09 LAB — VITAMIN D 25 HYDROXY (VIT D DEFICIENCY, FRACTURES): Vit D, 25-Hydroxy: 35.9 ng/mL (ref 30.0–100.0)

## 2022-10-21 ENCOUNTER — Ambulatory Visit (INDEPENDENT_AMBULATORY_CARE_PROVIDER_SITE_OTHER): Payer: BC Managed Care – PPO | Admitting: Family Medicine

## 2022-10-21 ENCOUNTER — Encounter (INDEPENDENT_AMBULATORY_CARE_PROVIDER_SITE_OTHER): Payer: Self-pay | Admitting: Family Medicine

## 2022-10-21 VITALS — BP 114/71 | HR 109 | Temp 98.8°F | Ht 64.0 in

## 2022-10-21 DIAGNOSIS — Z716 Tobacco abuse counseling: Secondary | ICD-10-CM

## 2022-10-21 DIAGNOSIS — E559 Vitamin D deficiency, unspecified: Secondary | ICD-10-CM

## 2022-10-21 DIAGNOSIS — D751 Secondary polycythemia: Secondary | ICD-10-CM | POA: Diagnosis not present

## 2022-10-21 DIAGNOSIS — E669 Obesity, unspecified: Secondary | ICD-10-CM

## 2022-10-21 DIAGNOSIS — Z6828 Body mass index (BMI) 28.0-28.9, adult: Secondary | ICD-10-CM | POA: Diagnosis not present

## 2022-10-21 MED ORDER — VITAMIN D (ERGOCALCIFEROL) 1.25 MG (50000 UNIT) PO CAPS: 50000.0000 [IU] | ORAL_CAPSULE | ORAL | 0 refills | Status: DC

## 2022-10-21 NOTE — Progress Notes (Deleted)
First few weeks she followed the plan 20%.  She felt this was insufficient but often got herself into situations that made it more difficult to follow plan.  She liked the sandwich option for lunch.  Has increased vegetable intake.   No plans for the next few weeks.  Feels the Vyvanse curbs her appetite.  Had a craving once and had 4 animal crackers. Breakfast she likes the option of having a sandwich.  She likes the sandwich option at lunch as well and also likes the option of a salad.  Dinner she wants to be able to do anything in an air fryer, pasta or anything she can easily prepare.  She is interested in possibly incorporating other already prepared foods to be able to get the nutrition in consistently.

## 2022-10-21 NOTE — Progress Notes (Signed)
Chief Complaint:   OBESITY Lacey Bright (MR# [id number]) is a 22 y.o. female who presents for evaluation and treatment of obesity and related comorbidities. Current BMI is Body mass index is 31.21 kg/m. Lacey Bright has been struggling with her weight for many years and has been unsuccessful in either losing weight, maintaining weight loss, or reaching her healthy weight goal.  Lacey Bright is currently in the action stage of change and ready to dedicate time achieving and maintaining a healthier weight. Lacey Bright is interested in becoming our patient and working on intensive lifestyle modifications including (but not limited to) diet and exercise for weight loss.  Lacey Bright is currently going to [place] (previously went to [place] and then to [place]).  Wants to get into marketing.  She is currently vaping (on and off since 2016).  Times of feeling insatiable.  Coffee in am-toasted graham cracker+[employer], caramel creamer+egg biscuit (satisfied), Venti strawberry Refresher with lemonade+Egg whites (feels satisfied).  Lunch is Marius Ditch.  Dinner-Mushroom Risotto+Green beans.  Lacey Bright's habits were reviewed today and are as follows: Her family eats meals together, she thinks her family will eat healthier with her, her desired weight loss is 30 ponds, she started gaining weight in 2022, her heaviest weight ever was 192 pounds, she has significant food cravings issues, she snacks frequently in the evenings, she skips meals frequently, she is frequently drinking liquids with calories, she frequently makes poor food choices, she has problems with excessive hunger, and she struggles with emotional eating.  Depression Screen Lacey Bright's Food and Mood (modified PHQ-9) score was 13.      No data to display         Subjective:   1. Other fatigue EKG-NSR at 76 bpm.  Lacey Bright admits to daytime somnolence and reports waking up still tired. Patient has a history of symptoms of  daytime fatigue and morning fatigue. Lacey Bright generally gets 7 or 8 hours of sleep per night, and states that she has generally restful sleep. Snoring is not present. Apneic episodes are not present. Epworth Sleepiness Score is 9.    2. SOBOE (shortness of breath on exertion) Lacey Bright notes increasing shortness of breath with exercising and seems to be worsening over time with weight gain. She notes getting out of breath sooner with activity than she used to. This has not gotten worse recently. Lacey Bright denies shortness of breath at rest or orthopnea.   3. PCOS (polycystic ovarian syndrome) Diagnosed by provider at [facility].  Has an IUD.  4. 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  Sees Dr. Delice Lesch.  5. Anxiety and depression On Buspar, Propranolol.  Previously on Wellbutrin but that caused a seizure.  Assessment/Plan:   1. Other fatigue We will obtain labs today.  Lacey Bright does feel that her weight is causing her energy to be lower than it should be. Fatigue may be related to obesity, depression or many other causes. Labs will be ordered, and in the meanwhile, Lacey Bright will focus on self care including making healthy food choices, increasing physical activity and focusing on stress reduction.   - EKG 12-Lead - Vitamin B12 - VITAMIN D 25 Hydroxy (Vit-D Deficiency, Fractures) - TSH - T4, free - T3  2. SOBOE (shortness of breath on exertion) We will obtain labs today.  Lacey Bright does feel that she gets out of breath more easily that she used to when she exercises. Lacey Bright's shortness of breath appears to be obesity related and exercise induced. She has  agreed to work on weight loss and gradually increase exercise to treat her exercise induced shortness of breath. Will continue to monitor closely.   - CBC with Differential/Platelet - EKG 12-Lead - Lipid Panel With LDL/HDL Ratio  3. PCOS (polycystic ovarian syndrome) We will obtain  labs today.  - Hemoglobin A1c - Insulin, random  4. Nonintractable juvenile myoclonic epilepsy without status epilepticus ([place]) Will follow up with Dr. Delice Lesch.  5. Anxiety and depression Will follow up on symptoms at next appointment.  6. BMI 31.0-31.9,adult  7. Obesity with current BMI of Lacey Bright is currently in the action stage of change and her goal is to continue with weight loss efforts. I recommend Lacey Bright begin the structured treatment plan as follows:  She has agreed to the Category 2 Plan+100.  Exercise goals: No exercise has been prescribed at this time.   Behavioral modification strategies: increasing lean protein intake, meal planning and cooking strategies, keeping healthy foods in the home, and planning for success.  She was informed of the importance of frequent follow-up visits to maximize her success with intensive lifestyle modifications for her multiple health conditions. She was informed we would discuss her lab results at her next visit unless there is a critical issue that needs to be addressed sooner. Lacey Bright agreed to keep her next visit at the agreed upon time to discuss these results.  Objective:   Blood pressure 108/69, pulse 79, temperature 98.7 F (37.1 C), height '5\' 4"'$  (1.626 m), last menstrual period 10/01/2022, SpO2 99 %. Body mass index is 31.21 kg/m.  EKG: Normal sinus rhythm, rate 76 bpm.  Indirect Calorimeter completed today shows a VO2 of 229 ml and a REE of 1584.  Her calculated basal metabolic rate is 99991111 thus her basal metabolic rate is unchanged than expected.  General: Cooperative, alert, well developed, in no acute distress. HEENT: Conjunctivae and lids unremarkable. Cardiovascular: Regular rhythm.  Lungs: Normal work of breathing. Neurologic: No focal deficits.   Lab Results  Component Value Date   CREATININE 0.60 03/22/2017   BUN 10 03/22/2017   NA 140 03/22/2017   K 3.8 03/22/2017   CL 102 03/22/2017   CO2 25  09/05/2014   Lab Results  Component Value Date   ALT 10 09/05/2014   AST 19 09/05/2014   ALKPHOS 91 09/05/2014   BILITOT 0.5 09/05/2014   Lab Results  Component Value Date   HGBA1C 4.9 10/07/2022   Lab Results  Component Value Date   INSULIN 1.8 (L) 10/07/2022   Lab Results  Component Value Date   TSH 1.600 10/07/2022   Lab Results  Component Value Date   CHOL 156 10/07/2022   HDL 61 10/07/2022   LDLCALC 78 10/07/2022   TRIG 90 10/07/2022   Lab Results  Component Value Date   WBC 8.1 10/07/2022   HGB 16.9 (H) 10/07/2022   HCT 49.9 (H) 10/07/2022   MCV 90 10/07/2022   PLT 453 (H) 10/07/2022   No results found for: "IRON", "TIBC", "FERRITIN"  Attestation Statements:   Reviewed by clinician on day of visit: allergies, medications, problem list, medical history, surgical history, family history, social history, and previous encounter notes.  Time spent on visit including pre-visit chart review and post-visit charting and care was 60 minutes.   I, Elnora Morrison, RMA am acting as transcriptionist for Coralie Common, MD.  This is the patient's first visit at [facility]. The patient's NEW PATIENT PACKET was reviewed at length. Included in the packet:  current and past health history, medications, allergies, ROS, gynecologic history (women only), surgical history, family history, social history, weight history, weight loss surgery history (for those that have had weight loss surgery), nutritional evaluation, mood and food questionnaire, PHQ9, Epworth questionnaire, sleep habits questionnaire, patient life and health improvement goals questionnaire. These will all be scanned into the patient's chart under media.   During the visit, I independently reviewed the patient's EKG, bioimpedance scale results, and indirect calorimeter results. I used this information to tailor a meal plan for the patient that will help her to lose weight and will improve her  obesity-related conditions going forward. I performed a medically necessary appropriate examination and/or evaluation. I discussed the assessment and treatment plan with the patient. The patient was provided an opportunity to ask questions and all were answered. The patient agreed with the plan and demonstrated an understanding of the instructions. Labs were ordered at this visit and will be reviewed at the next visit unless more critical results need to be addressed immediately. Clinical information was updated and documented in the EMR.   I have reviewed the above documentation for accuracy and completeness, and I agree with the above. - Coralie Common, MD

## 2022-10-28 NOTE — Progress Notes (Signed)
Chief Complaint:   OBESITY Lacey Bright is here to discuss her progress with her obesity treatment plan along with follow-up of her obesity related diagnoses. Lacey Bright is on the Category 2 Plan+100 and states she is following her eating plan approximately 20% of the time. Lacey Bright states she is walking 2 miles.  Today's visit was #: 2 Starting weight: 170 lbs Starting date: 10/07/2022 Today's weight: 167 lbs Today's date: 10/21/2022 Total lbs lost to date: 3 lbs Total lbs lost since last in-office visit: 3  Interim History: First few weeks Lacey Bright followed the plan 20%.  She felt this was insufficient but often got herself into situations that made it more difficult to follow plan.  She liked the sandwich option for lunch.  Has increased vegetable intake.   No plans for the next few weeks.  Feels the Vyvanse curbs her appetite.  Had a craving once and had 4 animal crackers. Breakfast she likes the option of having a sandwich.  She likes the sandwich option at lunch as well and also likes the option of a salad.  Dinner she wants to be able to do anything in an air fryer, pasta or anything she can easily prepare.  She is interested in possibly incorporating other already prepared foods to be able to get the nutrition in consistently.   Subjective:   1. Vitamin D deficiency On vitamin D.  Notes fatigue.  2. Erythrocytosis Elevated RBC, H/H.  Lacey Bright does vape daily.  3. Encounter for tobacco use cessation counseling Lacey Bright is vaping daily.  Has been vaping since 22 years old.  Significant increase in HCTZ without prior lab elevation.  Assessment/Plan:   1. Vitamin D deficiency We will refill Vit D 50K IU once a week for 1 month with 0 refills.  Refill- Vitamin D, Ergocalciferol, (DRISDOL) 1.25 MG (50000 UNIT) CAPS capsule; Take 1 capsule (50,000 Units total) by mouth every 7 (seven) days.  Dispense: 4 capsule; Refill: 0  2. Erythrocytosis Will repeat labs in 3 months.  3.  Encounter for tobacco use cessation counseling Discussed at length tobacco/cessation and forms of nicotine replacement options and products.  This included adequate dosing and how to use.  4. Obesity with current BMI of 28.8 Lacey Bright is currently in the action stage of change. As such, her goal is to continue with weight loss efforts. She has agreed to the Category 2 Plan and keeping a food journal and adhering to recommended goals of 400-500 calories and 35+ grams of protein at supper.   Exercise goals: No exercise has been prescribed at this time.  Behavioral modification strategies: increasing lean protein intake, meal planning and cooking strategies, keeping healthy foods in the home, and planning for success.  Lacey Bright has agreed to follow-up with our clinic in 2 weeks. She was informed of the importance of frequent follow-up visits to maximize her success with intensive lifestyle modifications for her multiple health conditions.   Objective:   Blood pressure 114/71, pulse (!) 109, temperature 98.8 F (37.1 C), height '5\' 4"'$  (1.626 m), last menstrual period 10/01/2022, SpO2 98 %. Body mass index is 31.21 kg/m.  General: Cooperative, alert, well developed, in no acute distress. HEENT: Conjunctivae and lids unremarkable. Cardiovascular: Regular rhythm.  Lungs: Normal work of breathing. Neurologic: No focal deficits.   Lab Results  Component Value Date   CREATININE 0.60 03/22/2017   BUN 10 03/22/2017   NA 140 03/22/2017   K 3.8 03/22/2017   CL 102 03/22/2017   CO2  25 09/05/2014   Lab Results  Component Value Date   ALT 10 09/05/2014   AST 19 09/05/2014   ALKPHOS 91 09/05/2014   BILITOT 0.5 09/05/2014   Lab Results  Component Value Date   HGBA1C 4.9 10/07/2022   Lab Results  Component Value Date   INSULIN 1.8 (L) 10/07/2022   Lab Results  Component Value Date   TSH 1.600 10/07/2022   Lab Results  Component Value Date   CHOL 156 10/07/2022   HDL 61 10/07/2022    LDLCALC 78 10/07/2022   TRIG 90 10/07/2022   Lab Results  Component Value Date   VD25OH 35.9 10/07/2022   Lab Results  Component Value Date   WBC 8.1 10/07/2022   HGB 16.9 (H) 10/07/2022   HCT 49.9 (H) 10/07/2022   MCV 90 10/07/2022   PLT 453 (H) 10/07/2022   No results found for: "IRON", "TIBC", "FERRITIN"  Attestation Statements:   Reviewed by clinician on day of visit: allergies, medications, problem list, medical history, surgical history, family history, social history, and previous encounter notes.  I, Elnora Morrison, RMA am acting as transcriptionist for Coralie Common, MD.  I have reviewed the above documentation for accuracy and completeness, and I agree with the above. - Coralie Common, MD

## 2022-11-06 ENCOUNTER — Ambulatory Visit (INDEPENDENT_AMBULATORY_CARE_PROVIDER_SITE_OTHER): Payer: BC Managed Care – PPO | Admitting: Family Medicine

## 2022-11-18 ENCOUNTER — Ambulatory Visit (INDEPENDENT_AMBULATORY_CARE_PROVIDER_SITE_OTHER): Payer: BC Managed Care – PPO | Admitting: Family Medicine

## 2022-11-18 ENCOUNTER — Encounter (INDEPENDENT_AMBULATORY_CARE_PROVIDER_SITE_OTHER): Payer: Self-pay | Admitting: Family Medicine

## 2022-11-18 VITALS — Temp 97.6°F | Ht 64.0 in | Wt 168.0 lb

## 2022-11-18 DIAGNOSIS — E559 Vitamin D deficiency, unspecified: Secondary | ICD-10-CM | POA: Diagnosis not present

## 2022-11-18 DIAGNOSIS — Z6828 Body mass index (BMI) 28.0-28.9, adult: Secondary | ICD-10-CM | POA: Diagnosis not present

## 2022-11-18 DIAGNOSIS — D751 Secondary polycythemia: Secondary | ICD-10-CM | POA: Diagnosis not present

## 2022-11-18 DIAGNOSIS — E669 Obesity, unspecified: Secondary | ICD-10-CM | POA: Diagnosis not present

## 2022-11-18 MED ORDER — VITAMIN D (ERGOCALCIFEROL) 1.25 MG (50000 UNIT) PO CAPS: 50000.0000 [IU] | ORAL_CAPSULE | ORAL | 0 refills | Status: DC

## 2022-11-18 NOTE — Progress Notes (Signed)
Chief Complaint:   OBESITY Lacey Bright is here to discuss her progress with her obesity treatment plan along with follow-up of her obesity related diagnoses. Lacey Bright is on Category 2 Plan and keeping a food journal and adhering to recommended goals of 400-500 calories and 35+ grams of protein at supper.  and states she is following her eating plan approximately 60% of the time. Lacey Bright states she is working out, walking for 60 minutes 1 time per week.  Today's visit was #: 3 Starting weight: 170 LBS Starting date: 10/07/2022 Today's weight: 168 LBS Today's date: 11/18/2022 Total lbs lost to date: 2 LBS Total lbs lost since last in-office visit: +1 LB  Interim History: Patient had spring break last week and stayed local. She is following meal plan but making substitutions to make the plan more doable and has to remind her self that she is on a budget.  She is wondering about how to make a smoothie in the am.  She has another speech this week which she is preparing for.  Has appointment with her psychiatrist to take about her Vyvanse.   Subjective:   1. Vitamin D deficiency Patient is on prescription vitamin D.  Patient denies nausea, vomiting, muscle weakness, but is positive for fatigue.  2. Erythrocytosis Patient is currently still vaping.  Last H&H and RBC elevated.  Assessment/Plan:   1. Vitamin D deficiency Refill- Vitamin D, Ergocalciferol, (DRISDOL) 1.25 MG (50000 UNIT) CAPS capsule; Take 1 capsule (50,000 Units total) by mouth every 7 (seven) days.  Dispense: 4 capsule; Refill: 0  2. Erythrocytosis Counseled patient on importance decreasing frequency of vaping.  3. Obesity with current BMI of 28.9 Lacey Bright is currently in the action stage of change. As such, her goal is to continue with weight loss efforts. She has agreed to Category 2 Plan and keeping a food journal and adhering to recommended goals of 400-500 calories and 35+ grams of protein at supper.   Exercise  goals: All adults should avoid inactivity. Some physical activity is better than none, and adults who participate in any amount of physical activity gain some health benefits.  Behavioral modification strategies: increasing lean protein intake, meal planning and cooking strategies, keeping healthy foods in the home, and planning for success.  Lacey Bright has agreed to follow-up with our clinic in 2-3 weeks. She was informed of the importance of frequent follow-up visits to maximize her success with intensive lifestyle modifications for her multiple health conditions.   Objective:   Temperature 97.6 F (36.4 C), height 5\' 4"  (1.626 m), weight 168 lb (76.2 kg), last menstrual period 11/18/2022. Body mass index is 28.84 kg/m.  General: Cooperative, alert, well developed, in no acute distress. HEENT: Conjunctivae and lids unremarkable. Cardiovascular: Regular rhythm.  Lungs: Normal work of breathing. Neurologic: No focal deficits.   Lab Results  Component Value Date   CREATININE 0.60 03/22/2017   BUN 10 03/22/2017   NA 140 03/22/2017   K 3.8 03/22/2017   CL 102 03/22/2017   CO2 25 09/05/2014   Lab Results  Component Value Date   ALT 10 09/05/2014   AST 19 09/05/2014   ALKPHOS 91 09/05/2014   BILITOT 0.5 09/05/2014   Lab Results  Component Value Date   HGBA1C 4.9 10/07/2022   Lab Results  Component Value Date   INSULIN 1.8 (L) 10/07/2022   Lab Results  Component Value Date   TSH 1.600 10/07/2022   Lab Results  Component Value Date   CHOL  156 10/07/2022   HDL 61 10/07/2022   LDLCALC 78 10/07/2022   TRIG 90 10/07/2022   Lab Results  Component Value Date   VD25OH 35.9 10/07/2022   Lab Results  Component Value Date   WBC 8.1 10/07/2022   HGB 16.9 (H) 10/07/2022   HCT 49.9 (H) 10/07/2022   MCV 90 10/07/2022   PLT 453 (H) 10/07/2022   No results found for: "IRON", "TIBC", "FERRITIN"  Attestation Statements:   Reviewed by clinician on day of visit: allergies,  medications, problem list, medical history, surgical history, family history, social history, and previous encounter notes.  I, Davy Pique, RMA, am acting as transcriptionist for Coralie Common, MD.  I have reviewed the above documentation for accuracy and completeness, and I agree with the above. - Coralie Common, MD

## 2022-12-07 ENCOUNTER — Other Ambulatory Visit (HOSPITAL_BASED_OUTPATIENT_CLINIC_OR_DEPARTMENT_OTHER): Payer: Self-pay

## 2022-12-11 ENCOUNTER — Ambulatory Visit (INDEPENDENT_AMBULATORY_CARE_PROVIDER_SITE_OTHER): Payer: BC Managed Care – PPO | Admitting: Family Medicine

## 2022-12-12 ENCOUNTER — Ambulatory Visit: Payer: BC Managed Care – PPO | Admitting: Podiatry

## 2022-12-12 ENCOUNTER — Encounter: Payer: Self-pay | Admitting: Podiatry

## 2022-12-12 DIAGNOSIS — L600 Ingrowing nail: Secondary | ICD-10-CM | POA: Diagnosis not present

## 2022-12-12 NOTE — Patient Instructions (Signed)

## 2022-12-14 NOTE — Progress Notes (Signed)
Subjective:   Patient ID: Lacey Bright, female   DOB: 22 y.o.   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  Today I infiltrated the right big toe 60 mg like Marcaine mixture sterile prep done using sterile instrumentation remove the lateral border exposed matrix applied phenol 3 applications 30 seconds followed by alcohol lavage sterile dressing gave instructions on soaks and to wear dressing 24 hours take off earlier if throbbing were to occur and encouraged her to call questions concerns which may arise

## 2022-12-18 ENCOUNTER — Ambulatory Visit: Payer: BC Managed Care – PPO | Admitting: Podiatry

## 2022-12-25 ENCOUNTER — Telehealth: Payer: Self-pay | Admitting: Neurology

## 2022-12-25 MED ORDER — LAMOTRIGINE ER 200 MG PO TB24: ORAL_TABLET | ORAL | 5 refills | Status: DC

## 2022-12-25 NOTE — Telephone Encounter (Signed)
Pt's mother came by and dropped off form. She stated the pt's email address needs to be on the prescription when it is sent in.

## 2022-12-25 NOTE — Telephone Encounter (Signed)
New message  Mom will be bring by the [employer] form.  1. Which medications need to be refilled? (please list name of each medication and dose if known) LamoTRIgine 200 MG TB24 24 hour tablet  2. Which pharmacy/location (including street and city if local pharmacy) is medication to be sent to? [facility]  NCPDP # [id number]    3. Do they need a 30 day or 90 day supply? 90 day supply

## 2022-12-25 NOTE — Telephone Encounter (Signed)
Refill sent in with pt email address on it ,

## 2023-01-05 ENCOUNTER — Telehealth: Payer: Self-pay | Admitting: Anesthesiology

## 2023-01-05 ENCOUNTER — Other Ambulatory Visit (HOSPITAL_COMMUNITY): Payer: Self-pay

## 2023-01-05 MED ORDER — DYANAVEL XR 20 MG PO CHER
20.0000 mg | CHEWABLE_EXTENDED_RELEASE_TABLET | Freq: Every morning | ORAL | 0 refills | Status: DC
  Filled 2023-01-05: qty 30, 30d supply, fill #0

## 2023-01-05 NOTE — Telephone Encounter (Signed)
Pt's mother Hilbum called stating pt is volunteering to go to a residential inpatient facility for depression/anxiety/substance abuse at [facility], states pt will need a note from her doctor stating on what to do if [name] has a seizure, give information about any triggers for seizures.  Call back number [phone]

## 2023-01-06 ENCOUNTER — Other Ambulatory Visit (HOSPITAL_COMMUNITY): Payer: Self-pay

## 2023-01-07 ENCOUNTER — Encounter: Payer: Self-pay | Admitting: Neurology

## 2023-01-07 NOTE — Telephone Encounter (Signed)
Patient mother called and left a VM for a nurse to give her call back about a letter

## 2023-01-07 NOTE — Telephone Encounter (Signed)
Done, thanks

## 2023-01-08 NOTE — Telephone Encounter (Signed)
Pt mother called an informed that letter is ready

## 2023-01-09 ENCOUNTER — Telehealth: Payer: Self-pay | Admitting: Neurology

## 2023-01-09 NOTE — Telephone Encounter (Signed)
Received a phone call that patient is in patient treatment center just had a seizure x 3 min -- the nurse there wants to talk with a nurse or MD regarding medication. The nurse that the facility stated that they have started her on gabapentin and the psycitrisit there would like to hold her dyanovel to see if that is the cause. I spoke with Dr Karel Jarvis she stated that the patient  can hold the Dyanavel, but Gabapentin can also worsen seizures in her type of Epilepsy fyi,and may be alcohol withdrawal as well. would not increase Lamictal for now. The nurse verbalized understanding,

## 2023-04-13 ENCOUNTER — Ambulatory Visit: Payer: BC Managed Care – PPO | Admitting: Neurology

## 2023-04-13 ENCOUNTER — Encounter: Payer: Self-pay | Admitting: Neurology

## 2023-04-13 DIAGNOSIS — Z029 Encounter for administrative examinations, unspecified: Secondary | ICD-10-CM

## 2023-05-05 ENCOUNTER — Other Ambulatory Visit (HOSPITAL_COMMUNITY): Payer: Self-pay

## 2023-05-05 MED ORDER — LISDEXAMFETAMINE DIMESYLATE 40 MG PO CAPS
40.0000 mg | ORAL_CAPSULE | Freq: Every morning | ORAL | 0 refills | Status: DC
  Filled 2023-05-05: qty 30, 30d supply, fill #0

## 2023-05-15 ENCOUNTER — Other Ambulatory Visit: Payer: Self-pay | Admitting: Neurology

## 2023-06-09 ENCOUNTER — Other Ambulatory Visit (HOSPITAL_COMMUNITY): Payer: Self-pay

## 2023-06-09 MED ORDER — LISDEXAMFETAMINE DIMESYLATE 40 MG PO CAPS
40.0000 mg | ORAL_CAPSULE | Freq: Every morning | ORAL | 0 refills | Status: DC
  Filled 2023-06-09: qty 30, 30d supply, fill #0

## 2023-06-09 MED ORDER — FLUOXETINE HCL 10 MG PO CAPS
10.0000 mg | ORAL_CAPSULE | Freq: Every day | ORAL | 3 refills | Status: DC
  Filled 2023-06-09: qty 30, 30d supply, fill #0

## 2023-06-17 ENCOUNTER — Other Ambulatory Visit (HOSPITAL_BASED_OUTPATIENT_CLINIC_OR_DEPARTMENT_OTHER): Payer: Self-pay

## 2023-06-17 MED ORDER — ZEPBOUND 2.5 MG/0.5ML ~~LOC~~ SOAJ
2.5000 mg | SUBCUTANEOUS | 0 refills | Status: DC
  Filled 2023-06-17: qty 2, 28d supply, fill #0

## 2023-06-19 ENCOUNTER — Other Ambulatory Visit (HOSPITAL_BASED_OUTPATIENT_CLINIC_OR_DEPARTMENT_OTHER): Payer: Self-pay

## 2023-06-25 ENCOUNTER — Other Ambulatory Visit (HOSPITAL_BASED_OUTPATIENT_CLINIC_OR_DEPARTMENT_OTHER): Payer: Self-pay

## 2023-06-25 MED ORDER — ZEPBOUND 5 MG/0.5ML ~~LOC~~ SOAJ
5.0000 mg | SUBCUTANEOUS | 0 refills | Status: DC
  Filled 2023-06-25 – 2023-07-13 (×3): qty 2, 28d supply, fill #0

## 2023-07-07 ENCOUNTER — Other Ambulatory Visit (HOSPITAL_BASED_OUTPATIENT_CLINIC_OR_DEPARTMENT_OTHER): Payer: Self-pay

## 2023-07-09 ENCOUNTER — Other Ambulatory Visit (HOSPITAL_COMMUNITY): Payer: Self-pay

## 2023-07-09 MED ORDER — LISDEXAMFETAMINE DIMESYLATE 40 MG PO CAPS
40.0000 mg | ORAL_CAPSULE | Freq: Every morning | ORAL | 0 refills | Status: DC
  Filled 2023-07-09: qty 30, 30d supply, fill #0

## 2023-07-13 ENCOUNTER — Other Ambulatory Visit (HOSPITAL_BASED_OUTPATIENT_CLINIC_OR_DEPARTMENT_OTHER): Payer: Self-pay

## 2023-07-13 ENCOUNTER — Other Ambulatory Visit (HOSPITAL_COMMUNITY): Payer: Self-pay

## 2023-07-16 ENCOUNTER — Other Ambulatory Visit: Payer: Self-pay

## 2023-08-11 ENCOUNTER — Other Ambulatory Visit (HOSPITAL_BASED_OUTPATIENT_CLINIC_OR_DEPARTMENT_OTHER): Payer: Self-pay

## 2023-08-11 MED ORDER — ZEPBOUND 5 MG/0.5ML ~~LOC~~ SOAJ
5.0000 mg | SUBCUTANEOUS | 3 refills | Status: DC
  Filled 2023-08-11: qty 2, 28d supply, fill #0
  Filled 2023-09-08: qty 2, 28d supply, fill #1
  Filled 2023-11-04: qty 2, 28d supply, fill #2
  Filled 2023-11-30: qty 2, 28d supply, fill #3

## 2023-09-07 ENCOUNTER — Other Ambulatory Visit (HOSPITAL_BASED_OUTPATIENT_CLINIC_OR_DEPARTMENT_OTHER): Payer: Self-pay

## 2023-09-07 MED ORDER — ZEPBOUND 5 MG/0.5ML ~~LOC~~ SOAJ
5.0000 mg | SUBCUTANEOUS | 3 refills | Status: DC
  Filled 2023-09-07 – 2023-10-07 (×2): qty 2, 28d supply, fill #0

## 2023-09-08 ENCOUNTER — Telehealth: Payer: Self-pay | Admitting: Neurology

## 2023-09-08 ENCOUNTER — Other Ambulatory Visit: Payer: Self-pay

## 2023-09-08 ENCOUNTER — Other Ambulatory Visit: Payer: BC Managed Care – PPO

## 2023-09-08 ENCOUNTER — Other Ambulatory Visit (HOSPITAL_BASED_OUTPATIENT_CLINIC_OR_DEPARTMENT_OTHER): Payer: Self-pay

## 2023-09-08 DIAGNOSIS — G40B09 Juvenile myoclonic epilepsy, not intractable, without status epilepticus: Secondary | ICD-10-CM

## 2023-09-08 NOTE — Telephone Encounter (Signed)
 Pt came into the office she  took her medication this morning she said she was shaky that's why she came to get her labs done, she wanted me to let Dr Georjean know she took her meds, I told her we normal want labs before she takes medication, she said she feels like she is shaking like a dog, I didn't notice any shaking,

## 2023-09-08 NOTE — Telephone Encounter (Signed)
 Patient called stating she woke up pretty shaky and she isn't sure why. She has seizures due to sleep issues but she slept pretty well. She wants to know if she needs to come in and get her blood checked to check her levels.

## 2023-09-09 ENCOUNTER — Other Ambulatory Visit (HOSPITAL_COMMUNITY): Payer: Self-pay

## 2023-09-12 LAB — LAMOTRIGINE LEVEL: Lamotrigine Lvl: 10 ug/mL (ref 2.5–15.0)

## 2023-09-17 ENCOUNTER — Other Ambulatory Visit (HOSPITAL_COMMUNITY): Payer: Self-pay

## 2023-09-18 ENCOUNTER — Other Ambulatory Visit (HOSPITAL_COMMUNITY): Payer: Self-pay

## 2023-09-18 MED ORDER — LISDEXAMFETAMINE DIMESYLATE 50 MG PO CAPS
50.0000 mg | ORAL_CAPSULE | ORAL | 0 refills | Status: DC
  Filled 2023-09-18 – 2023-09-22 (×5): qty 30, 30d supply, fill #0

## 2023-09-18 NOTE — Telephone Encounter (Signed)
Spoke with pt mother who is on [facility] Lamictal level was normal, called pt no answer no voice mail picked up

## 2023-09-18 NOTE — Telephone Encounter (Signed)
Pls check on how she is doing, the Lamictal level was fine, thanks

## 2023-09-21 ENCOUNTER — Other Ambulatory Visit (HOSPITAL_COMMUNITY): Payer: Self-pay

## 2023-09-21 NOTE — Telephone Encounter (Signed)
Pt called no answer left a voice mail to call the office back  °

## 2023-09-22 ENCOUNTER — Other Ambulatory Visit (HOSPITAL_COMMUNITY): Payer: Self-pay

## 2023-09-22 NOTE — Telephone Encounter (Signed)
My chart message sent to pt.

## 2023-10-07 ENCOUNTER — Other Ambulatory Visit (HOSPITAL_BASED_OUTPATIENT_CLINIC_OR_DEPARTMENT_OTHER): Payer: Self-pay

## 2023-10-28 ENCOUNTER — Ambulatory Visit (INDEPENDENT_AMBULATORY_CARE_PROVIDER_SITE_OTHER): Payer: BC Managed Care – PPO | Admitting: Neurology

## 2023-10-28 ENCOUNTER — Encounter: Payer: Self-pay | Admitting: Neurology

## 2023-10-28 VITALS — BP 135/84 | HR 95 | Ht 64.0 in | Wt 163.0 lb

## 2023-10-28 DIAGNOSIS — G40B09 Juvenile myoclonic epilepsy, not intractable, without status epilepticus: Secondary | ICD-10-CM | POA: Diagnosis not present

## 2023-10-28 MED ORDER — CLONAZEPAM 0.5 MG PO TABS: ORAL_TABLET | ORAL | 5 refills | Status: DC

## 2023-10-28 MED ORDER — LAMOTRIGINE ER 200 MG PO TB24: ORAL_TABLET | ORAL | 3 refills | Status: DC
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
## 2023-10-28 NOTE — Patient Instructions (Signed)
 Always a pleasure to see you! Continue Lamotrigine ER 200mg : Take 2 tablets daily. Refills also sent for as needed clonazepam. Great job staying sober, I'm proud of you! Follow-up in 6 months, call for any changes.    Seizure Precautions: 1. If medication has been prescribed for you to prevent seizures, take it exactly as directed.  Do not stop taking the medicine without talking to your doctor first, even if you have not had a seizure in a long time.   2. Avoid activities in which a seizure would cause danger to yourself or to others.  Don't operate dangerous machinery, swim alone, or climb in high or dangerous places, such as on ladders, roofs, or girders.  Do not drive unless your doctor says you may.  3. If you have any warning that you may have a seizure, lay down in a safe place where you can't hurt yourself.    4.  No driving for 6 months from last seizure, as per [facility].   Please refer to the following link on the Epilepsy Foundation of America's website for more information: http://www.epilepsyfoundation.org/answerplace/Social/driving/drivingu.cfm   5.  Maintain good sleep hygiene. Avoid alcohol.  6.  Notify your neurology if you are planning pregnancy or if you become pregnant.  7.  Contact your doctor if you have any problems that may be related to the medicine you are taking.  8.  Call 911 and bring the patient back to the ED if:        A.  The seizure lasts longer than 5 minutes.       B.  The patient doesn't awaken shortly after the seizure  C.  The patient has new problems such as difficulty seeing, speaking or moving  D.  The patient was injured during the seizure  E.  The patient has a temperature over 102 F (39C)  F.  The patient vomited and now is having trouble breathing

## 2023-11-05 ENCOUNTER — Other Ambulatory Visit (HOSPITAL_BASED_OUTPATIENT_CLINIC_OR_DEPARTMENT_OTHER): Payer: Self-pay

## 2023-11-28 ENCOUNTER — Ambulatory Visit (HOSPITAL_COMMUNITY)
Admission: EM | Admit: 2023-11-28 | Discharge: 2023-11-28 | Disposition: A | Discharge status: Home / Self Care | Attending: Emergency Medicine | Admitting: Emergency Medicine

## 2023-11-28 ENCOUNTER — Encounter (HOSPITAL_COMMUNITY): Payer: Self-pay | Admitting: *Deleted

## 2023-11-28 ENCOUNTER — Other Ambulatory Visit: Payer: Self-pay

## 2023-11-28 DIAGNOSIS — J988 Other specified respiratory disorders: Secondary | ICD-10-CM | POA: Diagnosis not present

## 2023-11-28 DIAGNOSIS — B9789 Other viral agents as the cause of diseases classified elsewhere: Secondary | ICD-10-CM

## 2023-11-28 LAB — POC COVID19/FLU A&B COMBO
Covid Antigen, POC: NEGATIVE
Influenza A Antigen, POC: NEGATIVE
Influenza B Antigen, POC: NEGATIVE

## 2023-11-28 LAB — POCT RAPID STREP A (OFFICE): Rapid Strep A Screen: NEGATIVE
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
## 2023-11-28 NOTE — ED Triage Notes (Signed)
 PT reports sore throat started 2 day sago.

## 2023-11-28 NOTE — Discharge Instructions (Signed)
 Your strep, flu, and COVID testing were all negative today. I believe your symptoms are related to a viral respiratory illness. You can alternate between 650 mg of Tylenol and 400 to 600 mg of ibuprofen as needed for sore throat and headache. You can take Mucinex as needed for cough and congestion. Make sure you are staying hydrated and getting plenty of rest. Return here for symptoms persist or worsen.

## 2023-11-30 ENCOUNTER — Other Ambulatory Visit (HOSPITAL_BASED_OUTPATIENT_CLINIC_OR_DEPARTMENT_OTHER): Payer: Self-pay

## 2023-12-05 ENCOUNTER — Other Ambulatory Visit (HOSPITAL_BASED_OUTPATIENT_CLINIC_OR_DEPARTMENT_OTHER): Payer: Self-pay

## 2023-12-31 ENCOUNTER — Telehealth: Payer: Self-pay | Admitting: Neurology

## 2023-12-31 MED ORDER — CLONAZEPAM 0.5 MG PO TABS: ORAL_TABLET | ORAL | 5 refills | Status: DC

## 2023-12-31 NOTE — Telephone Encounter (Signed)
 Pt called in and left a message wanting an update. I let her know we have not heard back from Dr. Ty Gales. She says she thinks she has some at home somewhere so she thinks she might be fine for today. I let her know I would let them know she called in again and someone will let her know once we hear something.

## 2023-12-31 NOTE — Telephone Encounter (Signed)
 Patient needs to see if we can get a refill on her Klonopin  sent to the [facility] on [address] she states that she really needs this today

## 2023-12-31 NOTE — Telephone Encounter (Signed)
 Rx sent, thanks

## 2023-12-31 NOTE — Telephone Encounter (Signed)
 Left message with the after hour service on 12-31-23   Caller states that they needs  a note stating that she has Epilepsy and what precautions need to be taken at work

## 2024-01-04 ENCOUNTER — Other Ambulatory Visit (HOSPITAL_BASED_OUTPATIENT_CLINIC_OR_DEPARTMENT_OTHER): Payer: Self-pay

## 2024-01-05 ENCOUNTER — Other Ambulatory Visit (HOSPITAL_BASED_OUTPATIENT_CLINIC_OR_DEPARTMENT_OTHER): Payer: Self-pay

## 2024-01-06 ENCOUNTER — Other Ambulatory Visit (HOSPITAL_BASED_OUTPATIENT_CLINIC_OR_DEPARTMENT_OTHER): Payer: Self-pay

## 2024-01-06 MED ORDER — ZEPBOUND 5 MG/0.5ML ~~LOC~~ SOAJ
5.0000 mg | SUBCUTANEOUS | 3 refills | Status: DC
  Filled 2024-01-06: qty 2, 28d supply, fill #0
  Filled 2024-01-29: qty 2, 28d supply, fill #1
  Filled 2024-03-01: qty 2, 28d supply, fill #2
  Filled 2024-03-28: qty 2, 28d supply, fill #3

## 2024-01-21 ENCOUNTER — Telehealth: Payer: Self-pay | Admitting: Neurology

## 2024-01-21 MED ORDER — CLONAZEPAM 0.5 MG PO TABS: ORAL_TABLET | ORAL | 5 refills | Status: DC

## 2024-01-21 NOTE — Telephone Encounter (Signed)
 Pt called informed that Rx for clonazepam  sent. Pt was  remind  no driving until 6 months seizure-free

## 2024-01-21 NOTE — Telephone Encounter (Signed)
 Pt c/o: seizure Missed medications?  No. Sleep deprived?  Yes.   Alcohol intake?  No. Increased stress? Yes.   Any change in medication color or shape? No. Back to their usual baseline self?  Yes.  . If no, advise go to ER Current medications prescribed by Dr. Ty Gales:   LamoTRIgine  200 MG TB24 24 hour tablet  Take 2 tablets every morning  clonazePAM  (KLONOPIN ) 0.5 MG tablet TAKE 1 TABLET BY MOUTH AS NEEDED FOR SEIZURE RESCUE DO NOT TAKE MORE THAN 2 TABLETS A WEEK   She is going to get some sleep today to catch up on the sleep to help her symptoms. Pt needs a refill on her clonazepam 

## 2024-01-21 NOTE — Telephone Encounter (Signed)
 Pt is wanting to speak to someone about a note for work she is having some problems with her epilepsy

## 2024-01-21 NOTE — Telephone Encounter (Signed)
 Pls let her know Rx for clonazepam  sent. Pls remind her no driving until 6 months seizure-free, thanks

## 2024-01-29 ENCOUNTER — Other Ambulatory Visit (HOSPITAL_BASED_OUTPATIENT_CLINIC_OR_DEPARTMENT_OTHER): Payer: Self-pay

## 2024-02-03 ENCOUNTER — Other Ambulatory Visit (HOSPITAL_BASED_OUTPATIENT_CLINIC_OR_DEPARTMENT_OTHER): Payer: Self-pay

## 2024-03-01 ENCOUNTER — Other Ambulatory Visit (HOSPITAL_BASED_OUTPATIENT_CLINIC_OR_DEPARTMENT_OTHER): Payer: Self-pay

## 2024-03-28 ENCOUNTER — Other Ambulatory Visit (HOSPITAL_BASED_OUTPATIENT_CLINIC_OR_DEPARTMENT_OTHER): Payer: Self-pay

## 2024-04-24 ENCOUNTER — Other Ambulatory Visit (HOSPITAL_BASED_OUTPATIENT_CLINIC_OR_DEPARTMENT_OTHER): Payer: Self-pay

## 2024-04-26 ENCOUNTER — Other Ambulatory Visit (HOSPITAL_BASED_OUTPATIENT_CLINIC_OR_DEPARTMENT_OTHER): Payer: Self-pay

## 2024-04-26 MED ORDER — ZEPBOUND 5 MG/0.5ML ~~LOC~~ SOAJ
5.0000 mg | SUBCUTANEOUS | 3 refills | Status: DC
  Filled 2024-04-26: qty 2, 28d supply, fill #0
  Filled 2024-05-23: qty 2, 28d supply, fill #1
  Filled 2024-06-19: qty 2, 28d supply, fill #2
  Filled 2024-07-25: qty 2, 28d supply, fill #3

## 2024-05-23 ENCOUNTER — Other Ambulatory Visit: Payer: Self-pay

## 2024-05-24 ENCOUNTER — Other Ambulatory Visit (HOSPITAL_BASED_OUTPATIENT_CLINIC_OR_DEPARTMENT_OTHER): Payer: Self-pay

## 2024-05-30 ENCOUNTER — Encounter: Payer: Self-pay | Admitting: Neurology

## 2024-05-30 ENCOUNTER — Ambulatory Visit (INDEPENDENT_AMBULATORY_CARE_PROVIDER_SITE_OTHER): Payer: BC Managed Care – PPO | Admitting: Neurology

## 2024-05-30 VITALS — BP 123/86 | HR 101 | Ht 64.0 in | Wt 137.0 lb

## 2024-05-30 DIAGNOSIS — G40B09 Juvenile myoclonic epilepsy, not intractable, without status epilepticus: Secondary | ICD-10-CM

## 2024-05-30 MED ORDER — LAMOTRIGINE ER 200 MG PO TB24: ORAL_TABLET | ORAL | 3 refills | Status: AC

## 2024-05-30 MED ORDER — CLONAZEPAM 0.5 MG PO TABS: ORAL_TABLET | ORAL | 5 refills | Status: AC

## 2024-05-30 NOTE — Patient Instructions (Signed)
 Good to see you. Continue all your medications, refills sent. Follow-up in 6 months, call for any changes.    Seizure Precautions: 1. If medication has been prescribed for you to prevent seizures, take it exactly as directed.  Do not stop taking the medicine without talking to your doctor first, even if you have not had a seizure in a long time.   2. Avoid activities in which a seizure would cause danger to yourself or to others.  Don't operate dangerous machinery, swim alone, or climb in high or dangerous places, such as on ladders, roofs, or girders.  Do not drive unless your doctor says you may.  3. If you have any warning that you may have a seizure, lay down in a safe place where you can't hurt yourself.    4.  No driving for 6 months from last seizure, as per [place]  state law.   Please refer to the following link on the Epilepsy Foundation of America's website for more information: http://www.epilepsyfoundation.org/answerplace/Social/driving/drivingu.cfm   5.  Maintain good sleep hygiene. Avoid alcohol.  6.  Notify your neurology if you are planning pregnancy or if you become pregnant.  7.  Contact your doctor if you have any problems that may be related to the medicine you are taking.  8.  Call 911 and bring the patient back to the ED if:        A.  The seizure lasts longer than 5 minutes.       B.  The patient doesn't awaken shortly after the seizure  C.  The patient has new problems such as difficulty seeing, speaking or moving  D.  The patient was injured during the seizure  E.  The patient has a temperature over 102 F (39C)  F.  The patient vomited and now is having trouble breathing
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
## 2024-06-20 ENCOUNTER — Other Ambulatory Visit (HOSPITAL_BASED_OUTPATIENT_CLINIC_OR_DEPARTMENT_OTHER): Payer: Self-pay

## 2024-06-25 ENCOUNTER — Other Ambulatory Visit (HOSPITAL_BASED_OUTPATIENT_CLINIC_OR_DEPARTMENT_OTHER): Payer: Self-pay

## 2024-07-19 ENCOUNTER — Encounter: Payer: Self-pay | Admitting: Neurology

## 2024-07-25 ENCOUNTER — Other Ambulatory Visit (HOSPITAL_BASED_OUTPATIENT_CLINIC_OR_DEPARTMENT_OTHER): Payer: Self-pay

## 2024-08-19 ENCOUNTER — Other Ambulatory Visit (HOSPITAL_BASED_OUTPATIENT_CLINIC_OR_DEPARTMENT_OTHER): Payer: Self-pay

## 2024-08-22 ENCOUNTER — Other Ambulatory Visit (HOSPITAL_BASED_OUTPATIENT_CLINIC_OR_DEPARTMENT_OTHER): Payer: Self-pay

## 2024-08-22 MED ORDER — ZEPBOUND 5 MG/0.5ML ~~LOC~~ SOAJ
5.0000 mg | SUBCUTANEOUS | 3 refills | Status: AC
  Filled 2024-09-18: qty 2, 28d supply, fill #0

## 2024-08-22 MED FILL — Tirzepatide (Weight Mngmt) Soln Auto-Injector 5 MG/0.5ML: 5.0000 mg | SUBCUTANEOUS | 28 days supply | Qty: 2 | Fill #0 | Status: AC

## 2024-09-16 ENCOUNTER — Other Ambulatory Visit (HOSPITAL_COMMUNITY): Payer: Self-pay

## 2024-09-16 MED ORDER — ESCITALOPRAM OXALATE 10 MG PO TABS
ORAL_TABLET | ORAL | 0 refills | Status: AC
  Filled 2024-09-16: qty 30, 33d supply, fill #0

## 2024-09-18 ENCOUNTER — Other Ambulatory Visit: Payer: Self-pay

## 2024-09-18 ENCOUNTER — Other Ambulatory Visit (HOSPITAL_BASED_OUTPATIENT_CLINIC_OR_DEPARTMENT_OTHER): Payer: Self-pay

## 2024-12-20 ENCOUNTER — Ambulatory Visit: Admitting: Neurology
